# Patient Record
Sex: Female | Born: 1950 | Race: White | Hispanic: No | Marital: Married | Smoking: Former smoker
Health system: Southern US, Community
[De-identification: ages and names within clinical notes are randomized; demographics above are authoritative.]

## PROBLEM LIST (undated history)

## (undated) DIAGNOSIS — E079 Disorder of thyroid, unspecified: Secondary | ICD-10-CM

## (undated) DIAGNOSIS — C50919 Malignant neoplasm of unspecified site of unspecified female breast: Secondary | ICD-10-CM

## (undated) DIAGNOSIS — Z800 Family history of malignant neoplasm of digestive organs: Secondary | ICD-10-CM

## (undated) DIAGNOSIS — Z9221 Personal history of antineoplastic chemotherapy: Secondary | ICD-10-CM

## (undated) DIAGNOSIS — Z808 Family history of malignant neoplasm of other organs or systems: Secondary | ICD-10-CM

## (undated) DIAGNOSIS — Z803 Family history of malignant neoplasm of breast: Secondary | ICD-10-CM

## (undated) DIAGNOSIS — Z853 Personal history of malignant neoplasm of breast: Secondary | ICD-10-CM

## (undated) DIAGNOSIS — E785 Hyperlipidemia, unspecified: Secondary | ICD-10-CM

## (undated) DIAGNOSIS — Z923 Personal history of irradiation: Secondary | ICD-10-CM

## (undated) HISTORY — DX: Family history of malignant neoplasm of other organs or systems: Z80.8

## (undated) HISTORY — DX: Family history of malignant neoplasm of digestive organs: Z80.0

## (undated) HISTORY — PX: BREAST LUMPECTOMY WITH AXILLARY LYMPH NODE BIOPSY: SHX5593

## (undated) HISTORY — DX: Hyperlipidemia, unspecified: E78.5

## (undated) HISTORY — DX: Malignant neoplasm of unspecified site of unspecified female breast: C50.919

## (undated) HISTORY — DX: Family history of malignant neoplasm of breast: Z80.3

## (undated) HISTORY — PX: TONSILLECTOMY: SUR1361

## (undated) HISTORY — PX: WISDOM TOOTH EXTRACTION: SHX21

## (undated) HISTORY — DX: Disorder of thyroid, unspecified: E07.9

## (undated) HISTORY — PX: BREAST LUMPECTOMY: SHX2

## (undated) HISTORY — PX: COLONOSCOPY: SHX174

---
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
## 1999-05-12 ENCOUNTER — Other Ambulatory Visit: Admission: RE | Admit: 1999-05-12 | Discharge: 1999-05-12 | Payer: Self-pay | Admitting: Obstetrics and Gynecology

## 2001-05-23 ENCOUNTER — Encounter: Admission: RE | Admit: 2001-05-23 | Discharge: 2001-05-23 | Payer: Self-pay | Admitting: *Deleted

## 2001-05-23 ENCOUNTER — Other Ambulatory Visit: Admission: RE | Admit: 2001-05-23 | Discharge: 2001-05-23 | Payer: Self-pay | Admitting: Radiology

## 2001-05-23 ENCOUNTER — Encounter: Payer: Self-pay | Admitting: Obstetrics and Gynecology

## 2001-05-25 ENCOUNTER — Encounter: Admission: RE | Admit: 2001-05-25 | Discharge: 2001-05-25 | Payer: Self-pay | Admitting: General Surgery

## 2001-05-25 ENCOUNTER — Encounter: Payer: Self-pay | Admitting: General Surgery

## 2001-05-29 ENCOUNTER — Other Ambulatory Visit: Admission: RE | Admit: 2001-05-29 | Discharge: 2001-05-29 | Payer: Self-pay | Admitting: Obstetrics and Gynecology

## 2001-05-31 ENCOUNTER — Ambulatory Visit: Admission: RE | Admit: 2001-05-31 | Discharge: 2001-05-31 | Payer: Self-pay | Admitting: *Deleted

## 2001-05-31 ENCOUNTER — Encounter: Payer: Self-pay | Admitting: *Deleted

## 2001-06-01 ENCOUNTER — Encounter: Payer: Self-pay | Admitting: *Deleted

## 2001-06-01 ENCOUNTER — Encounter: Admission: RE | Admit: 2001-06-01 | Discharge: 2001-06-01 | Payer: Self-pay | Admitting: *Deleted

## 2001-06-04 ENCOUNTER — Encounter: Payer: Self-pay | Admitting: *Deleted

## 2001-06-04 ENCOUNTER — Ambulatory Visit (HOSPITAL_COMMUNITY): Admission: RE | Admit: 2001-06-04 | Discharge: 2001-06-04 | Payer: Self-pay | Admitting: *Deleted

## 2001-06-05 ENCOUNTER — Encounter: Payer: Self-pay | Admitting: General Surgery

## 2001-06-05 ENCOUNTER — Ambulatory Visit (HOSPITAL_COMMUNITY): Admission: RE | Admit: 2001-06-05 | Discharge: 2001-06-05 | Payer: Self-pay | Admitting: General Surgery

## 2001-06-22 ENCOUNTER — Other Ambulatory Visit: Admission: RE | Admit: 2001-06-22 | Discharge: 2001-06-22 | Payer: Self-pay | Admitting: Endocrinology

## 2001-07-26 ENCOUNTER — Encounter: Payer: Self-pay | Admitting: Hematology and Oncology

## 2001-07-26 ENCOUNTER — Ambulatory Visit (HOSPITAL_COMMUNITY): Admission: RE | Admit: 2001-07-26 | Discharge: 2001-07-26 | Payer: Self-pay | Admitting: Hematology and Oncology

## 2001-09-03 ENCOUNTER — Encounter: Admission: RE | Admit: 2001-09-03 | Discharge: 2001-09-03 | Payer: Self-pay | Admitting: General Surgery

## 2001-09-03 ENCOUNTER — Encounter: Payer: Self-pay | Admitting: General Surgery

## 2001-10-05 ENCOUNTER — Encounter: Admission: RE | Admit: 2001-10-05 | Discharge: 2001-10-05 | Payer: Self-pay | Admitting: General Surgery

## 2001-10-05 ENCOUNTER — Ambulatory Visit (HOSPITAL_COMMUNITY): Admission: RE | Admit: 2001-10-05 | Discharge: 2001-10-06 | Payer: Self-pay | Admitting: General Surgery

## 2001-10-05 ENCOUNTER — Encounter: Payer: Self-pay | Admitting: General Surgery

## 2001-10-05 ENCOUNTER — Encounter (INDEPENDENT_AMBULATORY_CARE_PROVIDER_SITE_OTHER): Payer: Self-pay | Admitting: *Deleted

## 2001-10-11 ENCOUNTER — Other Ambulatory Visit: Admission: RE | Admit: 2001-10-11 | Discharge: 2001-10-11 | Payer: Self-pay | Admitting: Radiology

## 2001-10-15 ENCOUNTER — Ambulatory Visit (HOSPITAL_COMMUNITY): Admission: RE | Admit: 2001-10-15 | Discharge: 2001-10-15 | Payer: Self-pay | Admitting: Endocrinology

## 2001-10-17 ENCOUNTER — Ambulatory Visit: Admission: RE | Admit: 2001-10-17 | Discharge: 2002-01-15 | Payer: Self-pay | Admitting: Radiation Oncology

## 2001-10-25 ENCOUNTER — Ambulatory Visit (HOSPITAL_COMMUNITY): Admission: RE | Admit: 2001-10-25 | Discharge: 2001-10-25 | Payer: Self-pay | Admitting: Endocrinology

## 2001-12-28 ENCOUNTER — Ambulatory Visit (HOSPITAL_COMMUNITY): Admission: RE | Admit: 2001-12-28 | Discharge: 2001-12-28 | Payer: Self-pay | Admitting: Endocrinology

## 2001-12-28 ENCOUNTER — Encounter: Payer: Self-pay | Admitting: Endocrinology

## 2002-01-30 ENCOUNTER — Encounter: Payer: Self-pay | Admitting: *Deleted

## 2002-01-30 ENCOUNTER — Ambulatory Visit (HOSPITAL_COMMUNITY): Admission: RE | Admit: 2002-01-30 | Discharge: 2002-01-30 | Payer: Self-pay | Admitting: *Deleted

## 2002-02-08 ENCOUNTER — Ambulatory Visit (HOSPITAL_COMMUNITY): Admission: RE | Admit: 2002-02-08 | Discharge: 2002-02-08 | Payer: Self-pay | Admitting: *Deleted

## 2002-02-08 ENCOUNTER — Encounter: Payer: Self-pay | Admitting: *Deleted

## 2002-03-15 ENCOUNTER — Encounter: Admission: RE | Admit: 2002-03-15 | Discharge: 2002-03-15 | Payer: Self-pay | Admitting: *Deleted

## 2002-03-15 ENCOUNTER — Encounter: Payer: Self-pay | Admitting: *Deleted

## 2002-06-03 ENCOUNTER — Other Ambulatory Visit: Admission: RE | Admit: 2002-06-03 | Discharge: 2002-06-03 | Payer: Self-pay | Admitting: Obstetrics and Gynecology

## 2002-07-01 ENCOUNTER — Encounter: Payer: Self-pay | Admitting: Endocrinology

## 2002-07-01 ENCOUNTER — Ambulatory Visit (HOSPITAL_COMMUNITY): Admission: RE | Admit: 2002-07-01 | Discharge: 2002-07-01 | Payer: Self-pay | Admitting: Endocrinology

## 2002-07-09 ENCOUNTER — Ambulatory Visit: Admission: RE | Admit: 2002-07-09 | Discharge: 2002-07-09 | Payer: Self-pay | Admitting: Radiation Oncology

## 2003-03-18 ENCOUNTER — Encounter: Admission: RE | Admit: 2003-03-18 | Discharge: 2003-03-18 | Payer: Self-pay | Admitting: Family Medicine

## 2003-03-18 ENCOUNTER — Encounter: Payer: Self-pay | Admitting: *Deleted

## 2003-07-17 ENCOUNTER — Other Ambulatory Visit: Admission: RE | Admit: 2003-07-17 | Discharge: 2003-07-17 | Payer: Self-pay | Admitting: Obstetrics and Gynecology

## 2003-09-01 ENCOUNTER — Ambulatory Visit (HOSPITAL_COMMUNITY): Admission: RE | Admit: 2003-09-01 | Discharge: 2003-09-01 | Payer: Self-pay | Admitting: Obstetrics and Gynecology

## 2003-09-01 ENCOUNTER — Encounter (INDEPENDENT_AMBULATORY_CARE_PROVIDER_SITE_OTHER): Payer: Self-pay | Admitting: *Deleted

## 2003-10-14 ENCOUNTER — Ambulatory Visit (HOSPITAL_COMMUNITY): Admission: RE | Admit: 2003-10-14 | Discharge: 2003-10-14 | Payer: Self-pay | Admitting: Endocrinology

## 2004-03-19 ENCOUNTER — Encounter: Admission: RE | Admit: 2004-03-19 | Discharge: 2004-03-19 | Payer: Self-pay | Admitting: General Surgery

## 2004-03-19 ENCOUNTER — Ambulatory Visit (HOSPITAL_COMMUNITY): Admission: RE | Admit: 2004-03-19 | Discharge: 2004-03-19 | Payer: Self-pay | Admitting: Hematology & Oncology

## 2004-08-06 ENCOUNTER — Ambulatory Visit (HOSPITAL_COMMUNITY): Admission: RE | Admit: 2004-08-06 | Discharge: 2004-08-06 | Payer: Self-pay | Admitting: Endocrinology

## 2004-09-10 ENCOUNTER — Ambulatory Visit: Payer: Self-pay | Admitting: Hematology & Oncology

## 2005-03-10 ENCOUNTER — Ambulatory Visit: Payer: Self-pay | Admitting: Hematology & Oncology

## 2005-03-28 ENCOUNTER — Encounter: Admission: RE | Admit: 2005-03-28 | Discharge: 2005-03-28 | Payer: Self-pay | Admitting: General Surgery

## 2005-05-04 ENCOUNTER — Encounter: Admission: RE | Admit: 2005-05-04 | Discharge: 2005-05-04 | Payer: Self-pay | Admitting: General Surgery

## 2005-06-02 ENCOUNTER — Ambulatory Visit (HOSPITAL_COMMUNITY): Admission: RE | Admit: 2005-06-02 | Discharge: 2005-06-02 | Payer: Self-pay | Admitting: Endocrinology

## 2005-06-14 ENCOUNTER — Encounter (INDEPENDENT_AMBULATORY_CARE_PROVIDER_SITE_OTHER): Payer: Self-pay | Admitting: *Deleted

## 2005-06-14 ENCOUNTER — Ambulatory Visit (HOSPITAL_COMMUNITY): Admission: RE | Admit: 2005-06-14 | Discharge: 2005-06-14 | Payer: Self-pay | Admitting: Endocrinology

## 2005-09-12 ENCOUNTER — Ambulatory Visit: Payer: Self-pay | Admitting: Hematology & Oncology

## 2006-01-23 ENCOUNTER — Ambulatory Visit (HOSPITAL_COMMUNITY): Admission: RE | Admit: 2006-01-23 | Discharge: 2006-01-23 | Payer: Self-pay | Admitting: Endocrinology

## 2006-02-09 ENCOUNTER — Ambulatory Visit: Payer: Self-pay | Admitting: Hematology & Oncology

## 2006-02-13 LAB — CBC WITH DIFFERENTIAL/PLATELET
Basophils Absolute: 0 10*3/uL (ref 0.0–0.1)
Eosinophils Absolute: 0.3 10*3/uL (ref 0.0–0.5)
HGB: 13.2 g/dL (ref 11.6–15.9)
MCV: 90.8 fL (ref 81.0–101.0)
MONO%: 7.9 % (ref 0.0–13.0)
NEUT#: 2.8 10*3/uL (ref 1.5–6.5)
RDW: 12.6 % (ref 11.3–14.5)

## 2006-02-13 LAB — COMPREHENSIVE METABOLIC PANEL
Albumin: 4.5 g/dL (ref 3.5–5.2)
Alkaline Phosphatase: 94 U/L (ref 39–117)
BUN: 27 mg/dL — ABNORMAL HIGH (ref 6–23)
Calcium: 9 mg/dL (ref 8.4–10.5)
Chloride: 107 mEq/L (ref 96–112)
Glucose, Bld: 92 mg/dL (ref 70–99)
Potassium: 4.2 mEq/L (ref 3.5–5.3)

## 2006-04-18 ENCOUNTER — Encounter: Admission: RE | Admit: 2006-04-18 | Discharge: 2006-04-18 | Payer: Self-pay | Admitting: General Surgery

## 2006-08-10 ENCOUNTER — Ambulatory Visit: Payer: Self-pay | Admitting: Hematology & Oncology

## 2006-08-14 LAB — CBC WITH DIFFERENTIAL/PLATELET
BASO%: 0.4 % (ref 0.0–2.0)
EOS%: 6.5 % (ref 0.0–7.0)
HCT: 38.2 % (ref 34.8–46.6)
MCH: 31.5 pg (ref 26.0–34.0)
MCHC: 34.5 g/dL (ref 32.0–36.0)
NEUT%: 57 % (ref 39.6–76.8)
RBC: 4.2 10*6/uL (ref 3.70–5.32)
RDW: 12.3 % (ref 11.3–14.5)
lymph#: 1.4 10*3/uL (ref 0.9–3.3)

## 2006-08-14 LAB — COMPREHENSIVE METABOLIC PANEL
ALT: 12 U/L (ref 0–35)
AST: 15 U/L (ref 0–37)
Calcium: 9.1 mg/dL (ref 8.4–10.5)
Chloride: 105 mEq/L (ref 96–112)
Creatinine, Ser: 0.73 mg/dL (ref 0.40–1.20)

## 2006-09-15 ENCOUNTER — Ambulatory Visit: Payer: Self-pay | Admitting: Internal Medicine

## 2007-02-08 ENCOUNTER — Ambulatory Visit: Payer: Self-pay | Admitting: Hematology & Oncology

## 2007-02-12 LAB — CBC WITH DIFFERENTIAL/PLATELET
Basophils Absolute: 0 10*3/uL (ref 0.0–0.1)
Eosinophils Absolute: 0.2 10*3/uL (ref 0.0–0.5)
HGB: 12.8 g/dL (ref 11.6–15.9)
MONO#: 0.4 10*3/uL (ref 0.1–0.9)
NEUT#: 3 10*3/uL (ref 1.5–6.5)
RDW: 12.5 % (ref 11.3–14.5)
lymph#: 1.4 10*3/uL (ref 0.9–3.3)

## 2007-02-12 LAB — COMPREHENSIVE METABOLIC PANEL
Albumin: 4.2 g/dL (ref 3.5–5.2)
BUN: 20 mg/dL (ref 6–23)
Calcium: 9 mg/dL (ref 8.4–10.5)
Chloride: 105 mEq/L (ref 96–112)
Glucose, Bld: 84 mg/dL (ref 70–99)
Potassium: 3.9 mEq/L (ref 3.5–5.3)

## 2007-04-10 ENCOUNTER — Ambulatory Visit (HOSPITAL_COMMUNITY): Admission: RE | Admit: 2007-04-10 | Discharge: 2007-04-10 | Payer: Self-pay | Admitting: Endocrinology

## 2007-04-30 ENCOUNTER — Encounter: Admission: RE | Admit: 2007-04-30 | Discharge: 2007-04-30 | Payer: Self-pay | Admitting: Obstetrics and Gynecology

## 2007-08-08 ENCOUNTER — Ambulatory Visit: Payer: Self-pay | Admitting: Hematology & Oncology

## 2007-08-13 LAB — COMPREHENSIVE METABOLIC PANEL
ALT: 10 U/L (ref 0–35)
AST: 15 U/L (ref 0–37)
Albumin: 4 g/dL (ref 3.5–5.2)
Alkaline Phosphatase: 71 U/L (ref 39–117)
BUN: 15 mg/dL (ref 6–23)
CO2: 28 mEq/L (ref 19–32)
Calcium: 8.3 mg/dL — ABNORMAL LOW (ref 8.4–10.5)
Chloride: 109 mEq/L (ref 96–112)
Creatinine, Ser: 0.69 mg/dL (ref 0.40–1.20)
Glucose, Bld: 84 mg/dL (ref 70–99)
Potassium: 3.8 mEq/L (ref 3.5–5.3)
Sodium: 146 mEq/L — ABNORMAL HIGH (ref 135–145)
Total Bilirubin: 0.3 mg/dL (ref 0.3–1.2)
Total Protein: 6.4 g/dL (ref 6.0–8.3)

## 2007-08-13 LAB — CBC WITH DIFFERENTIAL/PLATELET
Basophils Absolute: 0 10*3/uL (ref 0.0–0.1)
EOS%: 5.5 % (ref 0.0–7.0)
Eosinophils Absolute: 0.3 10*3/uL (ref 0.0–0.5)
HGB: 12.1 g/dL (ref 11.6–15.9)
MCH: 31.5 pg (ref 26.0–34.0)
NEUT#: 3.5 10*3/uL (ref 1.5–6.5)
RBC: 3.83 10*6/uL (ref 3.70–5.32)
RDW: 12.1 % (ref 11.3–14.5)
lymph#: 1.5 10*3/uL (ref 0.9–3.3)

## 2008-02-07 ENCOUNTER — Ambulatory Visit: Payer: Self-pay | Admitting: Hematology & Oncology

## 2008-03-18 ENCOUNTER — Encounter: Admission: RE | Admit: 2008-03-18 | Discharge: 2008-03-18 | Payer: Self-pay | Admitting: Hematology & Oncology

## 2008-05-30 ENCOUNTER — Encounter: Admission: RE | Admit: 2008-05-30 | Discharge: 2008-05-30 | Payer: Self-pay | Admitting: Hematology & Oncology

## 2008-06-10 ENCOUNTER — Encounter: Admission: RE | Admit: 2008-06-10 | Discharge: 2008-06-10 | Payer: Self-pay | Admitting: Hematology & Oncology

## 2008-09-22 ENCOUNTER — Ambulatory Visit (HOSPITAL_COMMUNITY): Admission: RE | Admit: 2008-09-22 | Discharge: 2008-09-22 | Payer: Self-pay | Admitting: Endocrinology

## 2008-10-28 ENCOUNTER — Encounter: Admission: RE | Admit: 2008-10-28 | Discharge: 2008-10-28 | Payer: Self-pay | Admitting: Endocrinology

## 2008-10-28 ENCOUNTER — Other Ambulatory Visit: Admission: RE | Admit: 2008-10-28 | Discharge: 2008-10-28 | Payer: Self-pay | Admitting: Interventional Radiology

## 2009-02-10 ENCOUNTER — Ambulatory Visit: Payer: Self-pay | Admitting: Hematology & Oncology

## 2009-02-12 ENCOUNTER — Ambulatory Visit: Payer: Self-pay | Admitting: Hematology & Oncology

## 2009-02-12 LAB — CBC WITH DIFFERENTIAL (CANCER CENTER ONLY)
BASO#: 0 10*3/uL (ref 0.0–0.2)
Eosinophils Absolute: 0.2 10*3/uL (ref 0.0–0.5)
HCT: 38.3 % (ref 34.8–46.6)
HGB: 13.1 g/dL (ref 11.6–15.9)
LYMPH#: 1.6 10*3/uL (ref 0.9–3.3)
NEUT#: 3.3 10*3/uL (ref 1.5–6.5)
NEUT%: 60.5 % (ref 39.6–80.0)
RBC: 4.32 10*6/uL (ref 3.70–5.32)

## 2009-02-12 LAB — COMPREHENSIVE METABOLIC PANEL
ALT: 13 U/L (ref 0–35)
AST: 15 U/L (ref 0–37)
Albumin: 4.2 g/dL (ref 3.5–5.2)
Calcium: 9.2 mg/dL (ref 8.4–10.5)
Chloride: 107 mEq/L (ref 96–112)
Potassium: 4.6 mEq/L (ref 3.5–5.3)
Total Protein: 7 g/dL (ref 6.0–8.3)

## 2009-04-29 ENCOUNTER — Encounter: Admission: RE | Admit: 2009-04-29 | Discharge: 2009-04-29 | Payer: Self-pay | Admitting: Endocrinology

## 2009-07-21 ENCOUNTER — Encounter: Admission: RE | Admit: 2009-07-21 | Discharge: 2009-07-21 | Payer: Self-pay | Admitting: Hematology & Oncology

## 2009-10-28 ENCOUNTER — Encounter: Admission: RE | Admit: 2009-10-28 | Discharge: 2009-10-28 | Payer: Self-pay | Admitting: Endocrinology

## 2010-01-24 ENCOUNTER — Ambulatory Visit: Payer: Self-pay | Admitting: Hematology & Oncology

## 2010-03-19 ENCOUNTER — Ambulatory Visit: Payer: Self-pay | Admitting: Hematology & Oncology

## 2010-03-22 LAB — COMPREHENSIVE METABOLIC PANEL
CO2: 27 mEq/L (ref 19–32)
Chloride: 108 mEq/L (ref 96–112)
Glucose, Bld: 126 mg/dL — ABNORMAL HIGH (ref 70–99)
Potassium: 3.7 mEq/L (ref 3.5–5.3)
Sodium: 143 mEq/L (ref 135–145)

## 2010-03-22 LAB — CBC WITH DIFFERENTIAL (CANCER CENTER ONLY)
BASO#: 0 10*3/uL (ref 0.0–0.2)
BASO%: 0.3 % (ref 0.0–2.0)
EOS%: 6.6 % (ref 0.0–7.0)
LYMPH%: 30.3 % (ref 14.0–48.0)
MCV: 90 fL (ref 81–101)
MONO%: 5.6 % (ref 0.0–13.0)
NEUT%: 57.2 % (ref 39.6–80.0)
RBC: 4.06 10*6/uL (ref 3.70–5.32)
WBC: 6.2 10*3/uL (ref 3.9–10.0)

## 2010-07-22 ENCOUNTER — Encounter: Admission: RE | Admit: 2010-07-22 | Discharge: 2010-07-22 | Payer: Self-pay | Admitting: Hematology & Oncology

## 2010-09-21 ENCOUNTER — Encounter
Admission: RE | Admit: 2010-09-21 | Discharge: 2010-09-21 | Payer: Self-pay | Source: Home / Self Care | Attending: Endocrinology | Admitting: Endocrinology

## 2010-10-17 ENCOUNTER — Encounter: Payer: Self-pay | Admitting: Endocrinology

## 2011-02-11 NOTE — Op Note (Signed)
[facility]  Patient:    Kristen Lynch, Kristen Lynch Visit Number: [id number] MRN: [id number]          Service Type: DSU Location: [id number] Attending Physician:  Carson Myrtle Dictated by:   Sheppard Plumber Earlene Plater, M.D. Proc. Date: 10/05/01 Admit Date:  10/05/2001 Discharge Date: 10/06/2001                             Operative Report  PREOPERATIVE DIAGNOSES: 1. Carcinoma, left breast. 2. Malfunctioning Port-A-Cath. 3. Thyroid nodule. 4. Skin lesions.  PROCEDURES: 1. Attempted revision of Port-A-Cath. 2. Reinstallation of Port-A-Cath. 3. Injection of sentinel dye. 4. Use of NeoProbe for mapping. 5. Sentinel lymph node biopsy x 2. 6. Partial mastectomy, left. 7. Aspiration of thyroid nodule. 8. Excision of skin lesion, left hand, right arm.  SURGEON:  Timothy E. Earlene Plater, M.D.  ASSISTANT:  Adolph Pollack, M.D.  ANESTHESIA:  C.R.N.A., supervised M.D.  CLINICAL NOTE:  Kristen Lynch is 39.  A breast cancer was discovered approximately three months ago.  Because of its size, she underwent neoadjuvant preoperative chemotherapy with satisfactory shrinkage of the tumor to allow for an attempted partial mastectomy instead of mastectomy.  A previously-placed Port-A-Cath is now malfunctioning.  She had a thyroid nodule that she and her endocrinologist wish to have aspirated.  She has severe sun exposure, and two lesions of the skin are requested to be biopsied.  She has been evaluated as an outpatient and by anesthesia today and is ready for surgery.  DESCRIPTION OF PROCEDURE:  The patient was taken to the operating room, placed supine, general endotracheal anesthesia administered.  Because of the late injection of the nuclear material, we approached the revision of the Port-A-Cath first. The patients entire upper chest, left breast, and axilla were prepped and draped in the usual fashion.  I cut down on the prior Port-A-Cath site in the right  upper chest.  It was dissected out of the tissue.  I disconnected the catheter from the port.  I tried to instill a wire into the catheter.  It would not proceed.  I then removed the port and catheter and attempted a percutaneous location of the right subclavian. Numerous attempts were made.  I isolated the vein easily, but the catheter would not thread.  There were no apparent complications.  I then isolated the left subclavian vein on the second pass.  The guidewire proceeded normally. We ascertained by real-time fluoroscopy that it was in good position.  The introducer was placed over the guidewire, and the catheter after irrigation was introduced through the introducer.  The introducer removed and the catheter positioned, carefully placed under fluoroscopy.  Because of the large opening in the right upper chest, I was able to thread the catheter across the chest from the insertion site in the left upper chest to the right upper chest location.  There was a nice, smooth curve, flow was good, aspiration was good, and I then connected the port to the Port-A-Cath, again tested it for flow and it was good.  I tested it with location by fluoroscopy, and that was good.  I sutured the Port-A-Cath to the pectoral fascia in the right upper quadrant and closed that wound in layers with 3-0 Monocryl.  I flushed the catheter with concentrated heparin solution.  I also evaluated both lung fields with fluoroscopy, and there was no evidence of pneumothorax.  Then I injected the  right tumor site and right subcu skin of the nipple with dye and massaged that for five minutes.  We then approached the left axilla with the NeoProbe, isolated one hot spot.  I cut down on this.  It was in the midaxilla under the pectoralis, an incision of approximately 2.5 cm, and with proper retraction we were able to isolate one hot node that was blue and one node that was moderately hot that was not stained.  We sent them  as specimens #1 and #2.  Bleeding was controlled with clips or cautery, and that wound was closed with a Monocryl as well.  I then approached the left breast mass.  A needle localization had been accomplished prior to surgery and using that as a guide, a curvilinear incision was made over the palpable mass in the left breast in the upper outer quadrant, incision of approximately 4 cm.  By blunt and sharp dissection, I removed a large mass or tissue encompassing essentially the entire left upper quadrant.  The wire was just below the level of dissection.  This encompassed the tissue from the edge of the areola to the upper outer quadrant and down to the pectoralis fascia.  I tagged the tissue for orientation for the pathologist and sent it down for their evaluation for margins.  Bleeding was controlled with the cautery, and I closed this wound with 3-0 Monocryl.  We then had heard from the pathologist that the two lymph nodes by touch prep were negative.  While we were waiting on the final frozen sections, I was able to isolate the thyroid nodule easily with the left hand, of course with new gloves and instruments, and I attempted aspiration of the thyroid nodule but there was no aspiratable liquid material, and there was no apparent complication.  Likewise, using fresh gloves and instruments, a lesion on the right upper arm posterior aspect and left hand posterior aspect were biopsied by simply slicing the small lesions off and cauterizing the base.  By this time we had heard from the pathologist that the margins were apparently negative and with this, the entire operation had come to a conclusion.  All counts were correct. All instruments were correct.  Steri-Strips had been applied to all wounds, and dry sterile dressings were applied.  She tolerated well the entire operation with just less than two hours operating time, and she was removed from the recovery room in good  condition. Dictated by:   Sheppard Plumber Earlene Plater, M.D. Attending Physician:  Carson Myrtle DD:  10/07/01 TD:  10/08/01 Job: [id number]

## 2011-02-11 NOTE — Op Note (Signed)
NAME:  Kristen Lynch, Kristen Lynch                          ACCOUNT NO.:  [id number]   MEDICAL RECORD NO.:  [id number]                   PATIENT TYPE:  AMB   LOCATION:  SDC                                  FACILITY:  [facility]   PHYSICIAN:  Lenoard Aden, M.D.             DATE OF BIRTH:  Jan 13, 1951   DATE OF PROCEDURE:  09/01/2003  DATE OF DISCHARGE:                                 OPERATIVE REPORT   PREOPERATIVE DIAGNOSES:  1. Postmenopausal bleeding on tamoxifen.  2. History of breast cancer.  3. Postoperative multiple endometrial polyps.   PROCEDURE:  Diagnostic hysteroscopy, resectoscopic polypectomy, D&C.   SURGEON:  Lenoard Aden, M.D.   ANESTHESIA:  General anesthesia.   ESTIMATED BLOOD LOSS:  750 mL.   COMPLICATIONS:  None.   FLUID DEFICIT:  100 mL.   SPECIMENS:  Multiple uterine polyps and endometrial curettings to pathology.   DESCRIPTION OF PROCEDURE:  After being apprised of risks of anesthesia,  infection, bleeding, uterine perforation and possible need for repair, the  patient is brought to the operating room where she is administered general  anesthetic, prepped and draped in usual fashion, was catheterized until the  bladder was empty.  After achieving adequate anesthesia, dilute Nesacaine  solution placed in the __________ paracervical block at 4 and 8 o'clock.  The standard intracervical anesthesia using a dilute Pitressin solution is  placed at 3 and 9 o'clock at the cervicovaginal junction using 16 mL of  dilute Pitressin solution.  At this time, cervix easily dilated up to #31  [facility] dilator and hysteroscope reveals a large right lower uterine segment  polyp and a fundal anterior wall polyp as previously noted on sonohystogram,  the double angle loop is used to resect the lower polyp using two sweeps  with complete removal.  The upper polyp is adherent to the back wall.  Thereby, this is initially shaved off in half and bivalve using the double  angle loop and  then removed in its entirety using multiple passes.  Bilateral tuberosity is noted.  Uterine endometrial cavity integrity is  intact.  No evidence of perforation.  Good hemostasis is noted.  Fluid  deficit of 100 mL is noted.  Sharp curetting of the endometrial cavity is  performed using a serrated endometrial curette with minimal curettings  obtained.  Hysteroscope is removed.  Minimal bleeding noted.  Tenaculum and  speculum is removed.  The patient is awakened and transferred to recovery in  good condition.                                               Lenoard Aden, M.D.    RJT/MEDQ  D:  09/01/2003  T:  09/02/2003  Job:  [id number]

## 2011-02-11 NOTE — Procedures (Signed)
[facility]  Patient:    Kristen Lynch, Kristen Lynch Visit Number: [id number] MRN: [id number]          Service Type: DSU Location: DAY Attending Physician:  Carson Myrtle Dictated by:   Sheppard Plumber Earlene Plater, M.D. Proc. Date: 06/05/01 Admit Date:  06/05/2001                             Procedure Report  PREOPERATIVE DIAGNOSIS:  Carcinoma left breast.  POSTOPERATIVE DIAGNOSIS:  Carcinoma left breast.  OPERATION/PROCEDURE:  Insertion of Port-A-Cath.  SURGEON: Timothy E. Earlene Plater, M.D.  ANESTHESIA:  Local standby.  DESCRIPTION OF PROCEDURE:  This patient is 6 and recently diagnosed with a moderately large tumor, and a moderately small left breast.  She has elected to proceed with preoperative chemoradiation and a Port-A-Cath is being inserted today for triple chemotherapy.  She agrees and understands the procedure.  The patient was brought to the room, placed supine, carefully padded and cushioned, and positioned.  She was then placed in the head-down position, and the upper chest and neck were prepped and draped in the usual fashion.  The right side was approached using 1/4% Marcaine with epinephrine throughout for local anesthesia.  The right subclavian vein was isolated on the first pass, guide wire inserted, and ascertained to be correct by fluoroscopy.  The introducer placed over the wire, ascertained to be in the correct position, guidewire removed, and we introduced a heparinized 8.4 French catheter through the introducer sheath.  As we were leaving the introducer sheath, the catheter would not progress forward.  So, I manipulated that with three different guidewires and was unable to advance the Port-A-Cath catheter into the appropriate position. So, I removed the entire apparatus and again we approached the right subclavian vein which was isolated on the first pass.  I introduced a new guidewire, passed it well down into the inferior vena cava and  then passed the introducer sheath over the guidewire.  It was in good position.  The guidewire was removed, and now the Port-A-Cath catheter went easily into the right position and the introducer sheath removed without any trouble.  This was positioned correctly under real-time fluoroscopy.  The catheter had been ______.  It was now clamped and then over the right anterior chest a pocket was made for the port site and the catheter was tunneled from the introduction site to the port site, the appropriate length cut, and the locking device applied to the catheter.  The catheter attached to the port and the locking mechanism closed.  All seemed to be in good shape. Fluoroscopy was used again and there were no kinks and no complications.  A photograph was made of this fluoroscopy picture showing a good smooth alignment of the catheter and port.  The port was then sewn to the prepectoral fascia with 2-0 Prolene and the wound was closed in layers with 3-0 Monocryl. Steri-Strips applied.  OpSite applied over the port site and the full strength Heparin then was used to irrigate and lock the port.  This was left intact for chemotherapy tomorrow.  Counts were correct.  Dry sterile bandage applied.  She tolerated well and was removed to recovery room in good condition.  A follow up chest x-ray will be made.  She tolerated well.  Instructions given to her and her family and she will be followed as an outpatient. Dictated by:   Sheppard Plumber Earlene Plater, M.D. Attending Physician:  Carson Myrtle DD:  06/05/01 TD:  06/05/01 Job: [id number]

## 2011-03-22 ENCOUNTER — Other Ambulatory Visit: Payer: Self-pay | Admitting: Hematology & Oncology

## 2011-03-22 ENCOUNTER — Encounter (HOSPITAL_BASED_OUTPATIENT_CLINIC_OR_DEPARTMENT_OTHER): Payer: Managed Care, Other (non HMO) | Admitting: Hematology & Oncology

## 2011-03-22 DIAGNOSIS — Z170 Estrogen receptor positive status [ER+]: Secondary | ICD-10-CM

## 2011-03-22 DIAGNOSIS — C50419 Malignant neoplasm of upper-outer quadrant of unspecified female breast: Secondary | ICD-10-CM

## 2011-03-22 LAB — CBC WITH DIFFERENTIAL (CANCER CENTER ONLY)
BASO%: 0.4 % (ref 0.0–2.0)
EOS%: 8 % — ABNORMAL HIGH (ref 0.0–7.0)
HGB: 12.5 g/dL (ref 11.6–15.9)
LYMPH#: 1.8 10*3/uL (ref 0.9–3.3)
MCHC: 34.1 g/dL (ref 32.0–36.0)
MONO#: 0.5 10*3/uL (ref 0.1–0.9)
NEUT#: 5 10*3/uL (ref 1.5–6.5)
RDW: 11.9 % (ref 11.1–15.7)
WBC: 8 10*3/uL (ref 3.9–10.0)

## 2011-03-22 LAB — COMPREHENSIVE METABOLIC PANEL
ALT: 13 U/L (ref 0–35)
CO2: 27 mEq/L (ref 19–32)
Calcium: 8.9 mg/dL (ref 8.4–10.5)
Chloride: 106 mEq/L (ref 96–112)
Creatinine, Ser: 0.7 mg/dL (ref 0.50–1.10)
Glucose, Bld: 112 mg/dL — ABNORMAL HIGH (ref 70–99)
Total Protein: 6.3 g/dL (ref 6.0–8.3)

## 2011-03-22 LAB — VITAMIN D 25 HYDROXY (VIT D DEFICIENCY, FRACTURES): Vit D, 25-Hydroxy: 53 ng/mL (ref 30–89)

## 2011-05-24 ENCOUNTER — Other Ambulatory Visit: Payer: Self-pay | Admitting: Endocrinology

## 2011-05-24 DIAGNOSIS — E049 Nontoxic goiter, unspecified: Secondary | ICD-10-CM

## 2011-06-15 ENCOUNTER — Other Ambulatory Visit: Payer: Self-pay | Admitting: Hematology & Oncology

## 2011-06-15 DIAGNOSIS — Z1231 Encounter for screening mammogram for malignant neoplasm of breast: Secondary | ICD-10-CM

## 2011-07-25 ENCOUNTER — Ambulatory Visit
Admission: RE | Admit: 2011-07-25 | Discharge: 2011-07-25 | Disposition: A | Discharge status: Home / Self Care | Payer: Managed Care, Other (non HMO) | Source: Ambulatory Visit | Attending: Hematology & Oncology | Admitting: Hematology & Oncology

## 2011-07-25 DIAGNOSIS — Z1231 Encounter for screening mammogram for malignant neoplasm of breast: Secondary | ICD-10-CM

## 2011-08-29 ENCOUNTER — Ambulatory Visit
Admission: RE | Admit: 2011-08-29 | Discharge: 2011-08-29 | Disposition: A | Discharge status: Home / Self Care | Payer: Managed Care, Other (non HMO) | Source: Ambulatory Visit | Attending: Endocrinology | Admitting: Endocrinology

## 2011-08-29 DIAGNOSIS — E049 Nontoxic goiter, unspecified: Secondary | ICD-10-CM

## 2012-03-16 ENCOUNTER — Other Ambulatory Visit: Payer: Self-pay | Admitting: Endocrinology

## 2012-03-16 DIAGNOSIS — E049 Nontoxic goiter, unspecified: Secondary | ICD-10-CM

## 2012-03-21 ENCOUNTER — Other Ambulatory Visit: Payer: Managed Care, Other (non HMO) | Admitting: Lab

## 2012-03-21 ENCOUNTER — Ambulatory Visit: Payer: Managed Care, Other (non HMO) | Admitting: Hematology & Oncology

## 2012-03-21 ENCOUNTER — Ambulatory Visit (HOSPITAL_BASED_OUTPATIENT_CLINIC_OR_DEPARTMENT_OTHER): Payer: Managed Care, Other (non HMO) | Admitting: Hematology & Oncology

## 2012-03-21 ENCOUNTER — Other Ambulatory Visit (HOSPITAL_BASED_OUTPATIENT_CLINIC_OR_DEPARTMENT_OTHER): Payer: Managed Care, Other (non HMO) | Admitting: Lab

## 2012-03-21 VITALS — BP 124/65 | HR 74 | Temp 97.8°F | Ht 64.0 in | Wt 131.0 lb

## 2012-03-21 DIAGNOSIS — M810 Age-related osteoporosis without current pathological fracture: Secondary | ICD-10-CM

## 2012-03-21 DIAGNOSIS — C50919 Malignant neoplasm of unspecified site of unspecified female breast: Secondary | ICD-10-CM

## 2012-03-21 DIAGNOSIS — Z853 Personal history of malignant neoplasm of breast: Secondary | ICD-10-CM

## 2012-03-21 LAB — CBC WITH DIFFERENTIAL (CANCER CENTER ONLY)
BASO#: 0 10*3/uL (ref 0.0–0.2)
BASO%: 0.3 % (ref 0.0–2.0)
EOS%: 4.5 % (ref 0.0–7.0)
HCT: 36.5 % (ref 34.8–46.6)
HGB: 12.1 g/dL (ref 11.6–15.9)
LYMPH#: 1.3 10*3/uL (ref 0.9–3.3)
MCH: 30.7 pg (ref 26.0–34.0)
MCHC: 33.2 g/dL (ref 32.0–36.0)
MONO%: 8.1 % (ref 0.0–13.0)
NEUT%: 70 % (ref 39.6–80.0)
RDW: 12 % (ref 11.1–15.7)

## 2012-03-21 NOTE — Progress Notes (Signed)
CC:   Kristen Lynch, M.D.  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  CURRENT THERAPY:  Observation.  INTERIM HISTORY:  Kristen Lynch comes in for followup.  We see her every year.  Since we last saw her, she has really done very well.  She is having some thyroid issues.  She has ultrasound done a couple of times a year.  She otherwise has really had no other problems.  She had a mammogram done, I think, every fall.  She had a mammogram done July 26, 2011.  Everything looked fine with the mammogram.  She went to the beach already.  She had a good time.  She was not as liberal with her sunscreen as I would like.  However, she says that she will be more conscientious.  She has spent a lot of a time with her mom.  She is still looking for work.  However, she feels that being with the mother is just as important if not more important.  She has had no problems with bowels or bladder.  She has had no headache.  There has been no double vision or blurred vision.  PHYSICAL EXAMINATION:  General:  This is a well-developed, well- nourished, white female in no obvious distress.  Vital Signs: Temperature of 97.8, pulse 74, respiratory rate 18, blood pressure 124/65.  Weight is 131.  Head and Neck:  Normocephalic, atraumatic skull.  There are no ocular or oral lesions.  There are no palpable cervical or supraclavicular lymph nodes.  Lungs:  Clear bilaterally. Cardiac:  Regular rate and rhythm with a normal S1 and S2.  There are no murmurs, rubs, or bruits.  Breasts:  Right breast with no masses, edema, or erythema.  There is no right axillary adenopathy.  Left breast shows some slight contraction from past surgery and radiation.  She has a well- healed lumpectomy at the 2 o'clock position.  There is some slight firmness at the lumpectomy site.  No distinct masses are noted in the left breast.  There is no left axillary adenopathy.  Abdomen:  Soft  with good bowel sounds.  There is no palpable abdominal mass.  There is no fluid wave.  There is no palpable hepatosplenomegaly.  Back:  No kyphosis or osteoporotic changes.  No tenderness is noted over the spine, ribs, or hips.  Extremities:  No clubbing, cyanosis, or edema. She has good range of motion of her joints.  Skin:  Very fair skin.  I do not see any suspicious hyperpigmented lesions.  LABORATORY STUDIES:  White cell count 7.4, hemoglobin 12.1, hematocrit 36.5, platelet count 192.  IMPRESSION:  Kristen Lynch is a 61 year old white female with stage IIA ductal carcinoma of the left breast.  She underwent lumpectomy back in 2003.  She did receive neoadjuvant chemotherapy with Taxotere/Adriamycin/Cytoxan.  She presented back in August of 2002.  She had 4 cycles of chemotherapy.  She is doing very well right now.  She was on adjuvant Arimidex following her surgery and radiation.  I do not see any evidence of recurrent disease.  I think her risk of recurrence is 5% or less at this point in time.  We will go ahead and plan to get her back in 1 more year for followup.    ______________________________ Josph Macho, M.D. PRE/MEDQ  D:  03/21/2012  T:  03/21/2012  Job:  [id number]

## 2012-03-21 NOTE — Progress Notes (Signed)
This office note has been dictated.

## 2012-03-22 LAB — COMPREHENSIVE METABOLIC PANEL
ALT: 13 U/L (ref 0–35)
AST: 18 U/L (ref 0–37)
Alkaline Phosphatase: 63 U/L (ref 39–117)
BUN: 19 mg/dL (ref 6–23)
Chloride: 108 mEq/L (ref 96–112)
Creatinine, Ser: 0.64 mg/dL (ref 0.50–1.10)
Total Bilirubin: 0.5 mg/dL (ref 0.3–1.2)

## 2012-03-26 ENCOUNTER — Telehealth: Payer: Self-pay | Admitting: *Deleted

## 2012-03-26 NOTE — Telephone Encounter (Signed)
Called patient to let her know that her labs and vitamin d were good per dr. Myna Hidalgo

## 2012-03-26 NOTE — Telephone Encounter (Signed)
Message copied by Anselm Jungling on Mon Mar 26, 2012 10:31 AM ------      Message from: Josph Macho      Created: Sun Mar 25, 2012  8:10 PM       Labs and vit D look good!!! Cindee Lame

## 2012-07-30 ENCOUNTER — Other Ambulatory Visit: Payer: Self-pay | Admitting: Hematology & Oncology

## 2012-07-30 DIAGNOSIS — Z1231 Encounter for screening mammogram for malignant neoplasm of breast: Secondary | ICD-10-CM

## 2012-07-30 DIAGNOSIS — Z853 Personal history of malignant neoplasm of breast: Secondary | ICD-10-CM

## 2012-07-30 DIAGNOSIS — Z9889 Other specified postprocedural states: Secondary | ICD-10-CM

## 2012-08-27 ENCOUNTER — Ambulatory Visit
Admission: RE | Admit: 2012-08-27 | Discharge: 2012-08-27 | Disposition: A | Discharge status: Home / Self Care | Payer: BC Managed Care – PPO | Source: Ambulatory Visit | Attending: Hematology & Oncology | Admitting: Hematology & Oncology

## 2012-08-27 DIAGNOSIS — Z853 Personal history of malignant neoplasm of breast: Secondary | ICD-10-CM

## 2012-08-27 DIAGNOSIS — Z1231 Encounter for screening mammogram for malignant neoplasm of breast: Secondary | ICD-10-CM

## 2012-08-27 DIAGNOSIS — Z9889 Other specified postprocedural states: Secondary | ICD-10-CM

## 2012-09-03 ENCOUNTER — Ambulatory Visit
Admission: RE | Admit: 2012-09-03 | Discharge: 2012-09-03 | Disposition: A | Discharge status: Home / Self Care | Payer: BC Managed Care – PPO | Source: Ambulatory Visit | Attending: Endocrinology | Admitting: Endocrinology

## 2012-09-03 DIAGNOSIS — E049 Nontoxic goiter, unspecified: Secondary | ICD-10-CM

## 2013-03-14 ENCOUNTER — Other Ambulatory Visit: Payer: Self-pay | Admitting: Endocrinology

## 2013-03-14 DIAGNOSIS — E041 Nontoxic single thyroid nodule: Secondary | ICD-10-CM

## 2013-03-21 ENCOUNTER — Ambulatory Visit (HOSPITAL_BASED_OUTPATIENT_CLINIC_OR_DEPARTMENT_OTHER): Payer: BC Managed Care – PPO | Admitting: Hematology & Oncology

## 2013-03-21 ENCOUNTER — Other Ambulatory Visit: Payer: Self-pay | Admitting: *Deleted

## 2013-03-21 ENCOUNTER — Other Ambulatory Visit (HOSPITAL_BASED_OUTPATIENT_CLINIC_OR_DEPARTMENT_OTHER): Payer: BC Managed Care – PPO | Admitting: Lab

## 2013-03-21 ENCOUNTER — Other Ambulatory Visit (HOSPITAL_BASED_OUTPATIENT_CLINIC_OR_DEPARTMENT_OTHER): Payer: BC Managed Care – PPO

## 2013-03-21 VITALS — BP 128/63 | HR 74 | Temp 98.1°F | Resp 16 | Ht 64.0 in | Wt 123.0 lb

## 2013-03-21 DIAGNOSIS — E559 Vitamin D deficiency, unspecified: Secondary | ICD-10-CM

## 2013-03-21 DIAGNOSIS — C50912 Malignant neoplasm of unspecified site of left female breast: Secondary | ICD-10-CM

## 2013-03-21 DIAGNOSIS — C50919 Malignant neoplasm of unspecified site of unspecified female breast: Secondary | ICD-10-CM

## 2013-03-21 DIAGNOSIS — R921 Mammographic calcification found on diagnostic imaging of breast: Secondary | ICD-10-CM

## 2013-03-21 DIAGNOSIS — R928 Other abnormal and inconclusive findings on diagnostic imaging of breast: Secondary | ICD-10-CM

## 2013-03-21 LAB — CBC WITH DIFFERENTIAL (CANCER CENTER ONLY)
BASO%: 0.5 % (ref 0.0–2.0)
Eosinophils Absolute: 0.2 10*3/uL (ref 0.0–0.5)
HCT: 39.6 % (ref 34.8–46.6)
HGB: 12.9 g/dL (ref 11.6–15.9)
LYMPH#: 1.3 10*3/uL (ref 0.9–3.3)
LYMPH%: 23.5 % (ref 14.0–48.0)
MCV: 95 fL (ref 81–101)
MONO#: 0.6 10*3/uL (ref 0.1–0.9)
NEUT%: 62.1 % (ref 39.6–80.0)
RBC: 4.18 10*6/uL (ref 3.70–5.32)
RDW: 12.1 % (ref 11.1–15.7)
WBC: 5.7 10*3/uL (ref 3.9–10.0)

## 2013-03-21 NOTE — Progress Notes (Signed)
This office note has been dictated.

## 2013-03-22 ENCOUNTER — Telehealth: Payer: Self-pay | Admitting: Oncology

## 2013-03-22 LAB — COMPREHENSIVE METABOLIC PANEL
ALT: 13 U/L (ref 0–35)
CO2: 30 mEq/L (ref 19–32)
Calcium: 9.1 mg/dL (ref 8.4–10.5)
Chloride: 108 mEq/L (ref 96–112)
Creatinine, Ser: 0.63 mg/dL (ref 0.50–1.10)
Glucose, Bld: 60 mg/dL — ABNORMAL LOW (ref 70–99)
Total Bilirubin: 0.5 mg/dL (ref 0.3–1.2)
Total Protein: 6.4 g/dL (ref 6.0–8.3)

## 2013-03-22 LAB — LACTATE DEHYDROGENASE: LDH: 158 U/L (ref 94–250)

## 2013-03-22 LAB — VITAMIN D 25 HYDROXY (VIT D DEFICIENCY, FRACTURES): Vit D, 25-Hydroxy: 44 ng/mL (ref 30–89)

## 2013-03-22 NOTE — Telephone Encounter (Addendum)
Message copied by Lacie Draft on Fri Mar 22, 2013 11:35 AM ------      Message from: Josph Macho      Created: Fri Mar 22, 2013  7:02 AM       Please call and let her know that her labs look good. Pete ------Spoke with pt regarding labs.

## 2013-03-22 NOTE — Progress Notes (Signed)
CC:   Kristen Lynch, M.D.  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  CURRENT THERAPY:  Observation.  INTERIM HISTORY:  [name] comes in for followup.  We see her yearly. She is doing quite well.  I got on her a little bit today because of a sunburn that she got done at the beach.  She has very fair skin. Otherwise, she seems to be doing fairly well.  She has had no problems with bowels or bladder.  There has been no cough or shortness breath. She I think is on thyroid medicine.  This is well regulated.  Her last mammogram was back in December.  Everything looked okay on the mammogram with no suspicious calcifications.  PHYSICAL EXAMINATION:  General:  This is a well-developed, well- nourished white female in no obvious distress.  Vital signs: Temperature of 98.1, pulse 74, respiratory rate 16, blood pressure 138/62.  Weight is 123.  Head and neck:  Normocephalic, atraumatic skull.  There are no ocular or oral lesions.  There are no palpable cervical or supraclavicular lymph nodes.  Lungs:  Clear bilaterally. Cardiac:  Regular rate and rhythm with a normal S1, S2.  There are no murmurs, rubs, or bruits.  Abdomen:  Soft with good bowel sounds.  There is no palpable abdominal mass.  There is no fluid wave.  There is no palpable hepatosplenomegaly.  Breasts:  Shows right breast no masses, edema or erythema.  There is no right axillary adenopathy.  Left breast shows well-healed lumpectomy at the 2 o'clock position.  There is some slight contraction of the left breast.  There is no distinct mass in the left breast.  There is no left axillary adenopathy.  Abdomen:  Soft with good bowel sounds.  There is no fluid wave.  There is no palpable hepatosplenomegaly.  Back:  Shows no kyphosis or osteoporotic changes. There is no tenderness over the spine, ribs, or hips.  Extremities: Show no clubbing, cyanosis, or edema.  Neurological:  Shows no  focal neurological deficits.  Skin:  Shows no suspicious hyperpigmented lesions.  LABORATORY STUDIES:  White cell count is 5.7, hemoglobin 13, hematocrit 39.6, platelet count 191.  IMPRESSION:  Kristen Lynch is a very charming 62 year old white female with history of stage IIA (T2 N0 M0)ductal carcinoma of the left breast.  She did receive adjuvant chemotherapy with TAC. She completed 4 cycles back in August of 2002.  She then received adjuvant radiation therapy.  She then had Arimidex.  We will get her back in 1 more year.  Again, I do not see that her risk of recurrence should be more than 5% to 10%.   ______________________________ Josph Macho, M.D. PRE/MEDQ  D:  03/21/2013  T:  03/22/2013  Job:  [id number]

## 2013-08-05 ENCOUNTER — Other Ambulatory Visit: Payer: Self-pay

## 2013-08-05 DIAGNOSIS — Z1231 Encounter for screening mammogram for malignant neoplasm of breast: Secondary | ICD-10-CM

## 2013-09-03 ENCOUNTER — Ambulatory Visit
Admission: RE | Admit: 2013-09-03 | Discharge: 2013-09-03 | Disposition: A | Discharge status: Home / Self Care | Payer: BC Managed Care – PPO | Source: Ambulatory Visit

## 2013-09-03 DIAGNOSIS — Z1231 Encounter for screening mammogram for malignant neoplasm of breast: Secondary | ICD-10-CM

## 2013-09-09 ENCOUNTER — Ambulatory Visit
Admission: RE | Admit: 2013-09-09 | Discharge: 2013-09-09 | Disposition: A | Discharge status: Home / Self Care | Payer: BC Managed Care – PPO | Source: Ambulatory Visit | Attending: Endocrinology | Admitting: Endocrinology

## 2013-09-09 DIAGNOSIS — E041 Nontoxic single thyroid nodule: Secondary | ICD-10-CM

## 2013-10-09 ENCOUNTER — Ambulatory Visit (INDEPENDENT_AMBULATORY_CARE_PROVIDER_SITE_OTHER): Payer: Self-pay | Admitting: General Surgery

## 2013-10-11 ENCOUNTER — Ambulatory Visit (INDEPENDENT_AMBULATORY_CARE_PROVIDER_SITE_OTHER): Payer: BC Managed Care – PPO | Admitting: Surgery

## 2013-10-11 ENCOUNTER — Encounter (INDEPENDENT_AMBULATORY_CARE_PROVIDER_SITE_OTHER): Payer: Self-pay

## 2013-10-11 ENCOUNTER — Encounter (INDEPENDENT_AMBULATORY_CARE_PROVIDER_SITE_OTHER): Payer: Self-pay | Admitting: Surgery

## 2013-10-11 VITALS — BP 122/70 | HR 72 | Temp 98.8°F | Resp 14 | Ht 64.0 in | Wt 125.8 lb

## 2013-10-11 DIAGNOSIS — D449 Neoplasm of uncertain behavior of unspecified endocrine gland: Secondary | ICD-10-CM

## 2013-10-11 DIAGNOSIS — D440 Neoplasm of uncertain behavior of thyroid gland: Secondary | ICD-10-CM

## 2013-10-11 DIAGNOSIS — E042 Nontoxic multinodular goiter: Secondary | ICD-10-CM

## 2013-10-11 NOTE — Patient Instructions (Signed)

## 2013-10-11 NOTE — Progress Notes (Signed)
General Surgery [facility]  Chief Complaint  Patient presents with  . New Evaluation    enlarging thyroid nodule, left - referral from Dr. Gareth Eagle    HISTORY: The patient is a 63 year old female previously evaluated our practice for thyroid nodules. She is followed by her endocrinologist, Dr. Gareth Eagle. Patient has had previous ultrasound examinations over the years. Her most recent study in December 2014 shows a dominant nodule in the left thyroid lobe with a maximum dimension of 4.9 cm. This is an interval increase in size compared to her prior ultrasound in December 2013. Right thyroid lobe contained multiple subcentimeter nodules. Patient has had prior fine-needle aspiration biopsies in 2000 03/05/2009. Each of these were benign.  Patient has had no prior head or neck surgery. She is currently taking Synthroid 100 mcg daily and has been on that medication for a number of years. There is a family history of goiter and a paternal grandmother and a paternal aunt. There is no family history of thyroid cancer. There is no family history of other endocrine neoplasm.  Recent laboratory studies show a suppressed TSH level of 0.04 and a slightly elevated total T4 level of 14.1  Past Medical History  Diagnosis Date  . Cancer     breast    Current Outpatient Prescriptions  Medication Sig Dispense Refill  . levothyroxine (SYNTHROID) 100 MCG tablet Take 100 mcg by mouth daily.      . rosuvastatin (CRESTOR) 10 MG tablet Take 10 mg by mouth daily.       No current facility-administered medications for this visit.    No Known Allergies  Family History  Problem Relation Age of Onset  . Cancer Mother     cervical  . Stroke Mother   . Heart disease Father   . Cancer Maternal Aunt     colon    History   Social History  . Marital Status: Married    Spouse Name: N/A    Number of Children: N/A  . Years of Education: N/A   Social History Main Topics  .  Smoking status: Former Smoker    Quit date: 09/26/1977  . Smokeless tobacco: Never Used  . Alcohol Use: No  . Drug Use: None  . Sexual Activity: None   Other Topics Concern  . None   Social History Narrative  . None    REVIEW OF SYSTEMS - PERTINENT POSITIVES ONLY: Patient does note occasional palpitations. She denies tremor. She denies all compressive symptoms.  EXAM: Filed Vitals:   10/11/13 0853  BP: 122/70  Pulse: 72  Temp: 98.8 F (37.1 C)  Resp: 14    GENERAL: well-developed, well-nourished, no acute distress HEENT: normocephalic; pupils equal and reactive; sclerae clear; dentition good; mucous membranes moist NECK:  Dominant smooth firm mass occupying the entire left thyroid lobe measuring approximately 4-5 cm in greatest dimension; isthmus and right thyroid lobe without palpable mass; asymmetric on extension; no palpable anterior or posterior cervical lymphadenopathy; no supraclavicular masses; no tenderness CHEST: clear to auscultation bilaterally without rales, rhonchi, or wheezes CARDIAC: regular rate and rhythm without significant murmur; peripheral pulses are full EXT:  non-tender without edema; no deformity NEURO: no gross focal deficits; no sign of tremor   LABORATORY RESULTS: See [facility] (CHL-Epic) for most recent results  RADIOLOGY RESULTS: See [facility] (CHL-Epic) for most recent results  IMPRESSION: #1 multiple thyroid nodules, dominant nodule left thyroid lobe measuring 4.9 cm of uncertain significance, interval enlargement #2  iatrogenic hyperthyroidism  PLAN: The patient and I reviewed all the above studies at length. I provided her with copies of her ultrasound results and her laboratory studies.  The patient and I discussed options for management. This includes continue close observation versus proceeding with thyroid surgery. We discussed the type of surgery to be employed and I have recommended total thyroidectomy if she elects to  undergo surgery. At this point there is no absolute indication for thyroidectomy.  Patient is slightly hyperthyroid and has had some episodes of palpitations. I am concerned about potentiating osteoporosis with her elevated thyroid hormone dosage. I will ask Dr. Wilson Singer to consider decreasing her dosage slightly in order to get her laboratory values back in a normal range. The high dosage is not working to suppress her thyroid nodules.  Patient would like to defer surgery at this time. I will schedule her to return for physical examination in one year. We will repeat her thyroid ultrasound before that office visit.  Earnstine Regal, MD, [facility]  Primary Care Physician: No primary provider on file.

## 2013-10-31 ENCOUNTER — Encounter (INDEPENDENT_AMBULATORY_CARE_PROVIDER_SITE_OTHER): Payer: Self-pay

## 2014-03-17 ENCOUNTER — Telehealth: Payer: Self-pay | Admitting: Hematology & Oncology

## 2014-03-17 NOTE — Telephone Encounter (Signed)
Pt moved 6-26 to 7-28 has to have tuesday's

## 2014-03-21 ENCOUNTER — Other Ambulatory Visit: Payer: BC Managed Care – PPO | Admitting: Lab

## 2014-03-21 ENCOUNTER — Ambulatory Visit: Payer: BC Managed Care – PPO | Admitting: Hematology & Oncology

## 2014-04-21 ENCOUNTER — Other Ambulatory Visit: Payer: Self-pay

## 2014-04-21 DIAGNOSIS — D440 Neoplasm of uncertain behavior of thyroid gland: Secondary | ICD-10-CM

## 2014-04-22 ENCOUNTER — Encounter: Payer: Self-pay | Admitting: Hematology & Oncology

## 2014-04-22 ENCOUNTER — Ambulatory Visit (HOSPITAL_BASED_OUTPATIENT_CLINIC_OR_DEPARTMENT_OTHER): Payer: BC Managed Care – PPO | Admitting: Hematology & Oncology

## 2014-04-22 ENCOUNTER — Other Ambulatory Visit (HOSPITAL_BASED_OUTPATIENT_CLINIC_OR_DEPARTMENT_OTHER): Payer: BC Managed Care – PPO | Admitting: Lab

## 2014-04-22 ENCOUNTER — Telehealth: Payer: Self-pay | Admitting: Hematology & Oncology

## 2014-04-22 VITALS — BP 136/60 | HR 65 | Temp 98.0°F | Resp 18 | Wt 126.0 lb

## 2014-04-22 DIAGNOSIS — D440 Neoplasm of uncertain behavior of thyroid gland: Secondary | ICD-10-CM

## 2014-04-22 DIAGNOSIS — C50912 Malignant neoplasm of unspecified site of left female breast: Secondary | ICD-10-CM

## 2014-04-22 DIAGNOSIS — T386X5A Adverse effect of antigonadotrophins, antiestrogens, antiandrogens, not elsewhere classified, initial encounter: Principal | ICD-10-CM

## 2014-04-22 DIAGNOSIS — M818 Other osteoporosis without current pathological fracture: Secondary | ICD-10-CM

## 2014-04-22 DIAGNOSIS — Z853 Personal history of malignant neoplasm of breast: Secondary | ICD-10-CM

## 2014-04-22 DIAGNOSIS — C50919 Malignant neoplasm of unspecified site of unspecified female breast: Secondary | ICD-10-CM

## 2014-04-22 HISTORY — DX: Malignant neoplasm of unspecified site of unspecified female breast: C50.919

## 2014-04-22 LAB — CBC WITH DIFFERENTIAL (CANCER CENTER ONLY)
BASO#: 0 10*3/uL (ref 0.0–0.2)
BASO%: 0.4 % (ref 0.0–2.0)
EOS ABS: 0.4 10*3/uL (ref 0.0–0.5)
EOS%: 7.4 % — ABNORMAL HIGH (ref 0.0–7.0)
HEMATOCRIT: 39 % (ref 34.8–46.6)
HEMOGLOBIN: 12.9 g/dL (ref 11.6–15.9)
LYMPH#: 1.2 10*3/uL (ref 0.9–3.3)
LYMPH%: 24 % (ref 14.0–48.0)
MCH: 30.8 pg (ref 26.0–34.0)
MCHC: 33.1 g/dL (ref 32.0–36.0)
MCV: 93 fL (ref 81–101)
MONO#: 0.4 10*3/uL (ref 0.1–0.9)
MONO%: 8.2 % (ref 0.0–13.0)
NEUT#: 3.1 10*3/uL (ref 1.5–6.5)
NEUT%: 60 % (ref 39.6–80.0)
Platelets: 207 10*3/uL (ref 145–400)
RBC: 4.19 10*6/uL (ref 3.70–5.32)
RDW: 11.9 % (ref 11.1–15.7)
WBC: 5.1 10*3/uL (ref 3.9–10.0)

## 2014-04-22 NOTE — Telephone Encounter (Signed)
Pt aware of 8-7 bone density

## 2014-04-23 ENCOUNTER — Telehealth: Payer: Self-pay | Admitting: Nurse Practitioner

## 2014-04-23 LAB — COMPREHENSIVE METABOLIC PANEL
ALT: 13 U/L (ref 0–35)
AST: 17 U/L (ref 0–37)
Albumin: 4.2 g/dL (ref 3.5–5.2)
Alkaline Phosphatase: 69 U/L (ref 39–117)
BILIRUBIN TOTAL: 0.6 mg/dL (ref 0.2–1.2)
BUN: 16 mg/dL (ref 6–23)
CO2: 29 meq/L (ref 19–32)
CREATININE: 0.67 mg/dL (ref 0.50–1.10)
Calcium: 9.2 mg/dL (ref 8.4–10.5)
Chloride: 107 mEq/L (ref 96–112)
GLUCOSE: 90 mg/dL (ref 70–99)
Potassium: 4.7 mEq/L (ref 3.5–5.3)
Sodium: 144 mEq/L (ref 135–145)
Total Protein: 6.3 g/dL (ref 6.0–8.3)

## 2014-04-23 LAB — LACTATE DEHYDROGENASE: LDH: 187 U/L (ref 94–250)

## 2014-04-23 LAB — VITAMIN D 25 HYDROXY (VIT D DEFICIENCY, FRACTURES): VIT D 25 HYDROXY: 45 ng/mL (ref 30–89)

## 2014-04-23 NOTE — Progress Notes (Signed)
Hematology and Oncology Follow Up Visit  Kristen Lynch [id number] 29-Aug-1951 63 y.o. 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  Lynch is back for followup. We see her yearly. She now was out from a her treatment I 13 years. She received adjuvant chemotherapy with Taxotere/Adriamycin/Cytoxan. She had 4 cycles. She then had radiation therapy. She was then placed on Arimidex. She lost her husband 15 years ago. She's been doing okay overall.  Unfortunately , a relative has breast cancer. She lives in [facility] had no problems with her bones. She is getting in the sun. She has not had any problems with skin cancer.  She's had no change in bowel or bladder habits.  Her last mammogram was in December.  She's not sure when her last bone density test was. I think it probably was about 5 or 6 years ago.          Medications: Current outpatient prescriptions:calcium citrate-vitamin D (CITRACAL+D) 315-200 MG-UNIT per tablet, Take 1 tablet by mouth 2 (two) times daily., Disp: , Rfl: ;  levothyroxine (SYNTHROID) 100 MCG tablet, Take 100 mcg by mouth daily., Disp: , Rfl: ;  rosuvastatin (CRESTOR) 10 MG tablet, Take 10 mg by mouth daily., Disp: , Rfl:   Allergies: No Known Allergies  Past Medical History, Surgical history, Social history, and Family History were reviewed and updated.  Review of Systems: As above  Physical Exam:  weight is 126 lb (57.153 kg). Her temperature is 98 F (36.7 C). Her blood pressure is 136/60 and her pulse is 65. Her respiration is 18.   Thin but well-nourished white female. Lungs are clear. Cardiac exam regular in rhythm. Breast exam shows right breast with no masses edema or erythema. There is no right axillary adenopathy. Left breast shows a well-healed lobectomy at the 2:00 position. There is some contraction of the  left breast. There is no mass in the left breast. There is no left axillary adenopathy. Abdomen is soft. There is no fluid wave. There is no palpable liver or spleen to. Exam shows no tenderness over the spine ribs or hips. Extremities shows no clubbing cyanosis or edema. Skin exam shows no suspicious skin lesions.  Lab Results  Component Value Date   WBC 5.1 04/22/2014   HGB 12.9 04/22/2014   HCT 39.0 04/22/2014   MCV 93 04/22/2014   PLT 207 04/22/2014     Chemistry      Component Value Date/Time   NA 144 04/22/2014 0844   K 4.7 04/22/2014 0844   CL 107 04/22/2014 0844   CO2 29 04/22/2014 0844   BUN 16 04/22/2014 0844   CREATININE 0.67 04/22/2014 0844      Component Value Date/Time   CALCIUM 9.2 04/22/2014 0844   ALKPHOS 69 04/22/2014 0844   AST 17 04/22/2014 0844   ALT 13 04/22/2014 0844   BILITOT 0.6 04/22/2014 0844         Impression and Plan: Kristen Lynch is 63 year old female. She is now 13 years from breast cancer. I really think that she is cured.  She she likes to see [place] yearly.  We will see what her vitamin D level is. I told her to take 2000 units of vitamin D daily. I also told her to take a baby aspirin a day.  I told her that if we could  help out with her cousin, I would be more than happy to. I think she is seeing a Garment/textile technologist in town. Kristen Napoleon, MD 7/29/20156:59 PM

## 2014-04-23 NOTE — Telephone Encounter (Addendum)
Message copied by Jimmy Footman on Wed Apr 23, 2014  9:52 AM ------      Message from: Volanda Napoleon      Created: Tue Apr 22, 2014 10:23 PM       Call - all labs are ok!!! Pete ------Pt verbalized understanding and appreciation.

## 2014-04-28 ENCOUNTER — Other Ambulatory Visit: Payer: Self-pay

## 2014-04-28 DIAGNOSIS — Z1231 Encounter for screening mammogram for malignant neoplasm of breast: Secondary | ICD-10-CM

## 2014-05-02 ENCOUNTER — Other Ambulatory Visit: Payer: BC Managed Care – PPO

## 2014-07-31 ENCOUNTER — Other Ambulatory Visit (INDEPENDENT_AMBULATORY_CARE_PROVIDER_SITE_OTHER): Payer: Self-pay

## 2014-07-31 DIAGNOSIS — E042 Nontoxic multinodular goiter: Secondary | ICD-10-CM

## 2014-08-18 ENCOUNTER — Ambulatory Visit
Admission: RE | Admit: 2014-08-18 | Discharge: 2014-08-18 | Disposition: A | Discharge status: Home / Self Care | Payer: BC Managed Care – PPO | Source: Ambulatory Visit | Attending: Surgery | Admitting: Surgery

## 2014-08-18 DIAGNOSIS — E042 Nontoxic multinodular goiter: Secondary | ICD-10-CM

## 2014-09-04 ENCOUNTER — Ambulatory Visit
Admission: RE | Admit: 2014-09-04 | Discharge: 2014-09-04 | Disposition: A | Discharge status: Home / Self Care | Payer: BC Managed Care – PPO | Source: Ambulatory Visit

## 2014-09-04 ENCOUNTER — Ambulatory Visit
Admission: RE | Admit: 2014-09-04 | Discharge: 2014-09-04 | Disposition: A | Discharge status: Home / Self Care | Payer: BC Managed Care – PPO | Source: Ambulatory Visit | Attending: Hematology & Oncology | Admitting: Hematology & Oncology

## 2014-09-04 DIAGNOSIS — Z1231 Encounter for screening mammogram for malignant neoplasm of breast: Secondary | ICD-10-CM

## 2014-09-04 DIAGNOSIS — D440 Neoplasm of uncertain behavior of thyroid gland: Secondary | ICD-10-CM

## 2014-09-04 DIAGNOSIS — M818 Other osteoporosis without current pathological fracture: Secondary | ICD-10-CM

## 2014-09-04 DIAGNOSIS — T386X5A Adverse effect of antigonadotrophins, antiestrogens, antiandrogens, not elsewhere classified, initial encounter: Principal | ICD-10-CM

## 2014-09-09 ENCOUNTER — Telehealth: Payer: Self-pay | Admitting: Nurse Practitioner

## 2014-09-09 NOTE — Telephone Encounter (Addendum)
-----   Message from Volanda Napoleon, MD sent at 09/05/2014  5:35 PM EST ----- Please call her and let her know that she has some osteopenia. What is she taking for this aside vitamin D??? Pete  Pt verbalized understanding. She has not been taking the 1000mg  of Calcium as advised. Will begin taking the Ca with Vit D to facilitate bone improvement.

## 2014-09-29 ENCOUNTER — Encounter: Payer: Self-pay | Admitting: Hematology & Oncology

## 2015-04-23 ENCOUNTER — Ambulatory Visit (HOSPITAL_BASED_OUTPATIENT_CLINIC_OR_DEPARTMENT_OTHER): Payer: 59 | Admitting: Hematology & Oncology

## 2015-04-23 ENCOUNTER — Other Ambulatory Visit (HOSPITAL_BASED_OUTPATIENT_CLINIC_OR_DEPARTMENT_OTHER): Payer: 59

## 2015-04-23 ENCOUNTER — Encounter: Payer: Self-pay | Admitting: Hematology & Oncology

## 2015-04-23 VITALS — BP 155/53 | HR 59 | Temp 98.1°F | Resp 14 | Ht 64.0 in | Wt 125.0 lb

## 2015-04-23 DIAGNOSIS — Z853 Personal history of malignant neoplasm of breast: Secondary | ICD-10-CM

## 2015-04-23 DIAGNOSIS — C50912 Malignant neoplasm of unspecified site of left female breast: Secondary | ICD-10-CM

## 2015-04-23 DIAGNOSIS — M8580 Other specified disorders of bone density and structure, unspecified site: Secondary | ICD-10-CM | POA: Diagnosis not present

## 2015-04-23 DIAGNOSIS — E559 Vitamin D deficiency, unspecified: Secondary | ICD-10-CM

## 2015-04-23 LAB — CBC WITH DIFFERENTIAL (CANCER CENTER ONLY)
BASO#: 0 10*3/uL (ref 0.0–0.2)
BASO%: 0.5 % (ref 0.0–2.0)
EOS%: 9.1 % — AB (ref 0.0–7.0)
Eosinophils Absolute: 0.5 10*3/uL (ref 0.0–0.5)
HCT: 37.4 % (ref 34.8–46.6)
HEMOGLOBIN: 12.4 g/dL (ref 11.6–15.9)
LYMPH#: 1.6 10*3/uL (ref 0.9–3.3)
LYMPH%: 28.5 % (ref 14.0–48.0)
MCH: 31.4 pg (ref 26.0–34.0)
MCHC: 33.2 g/dL (ref 32.0–36.0)
MCV: 95 fL (ref 81–101)
MONO#: 0.5 10*3/uL (ref 0.1–0.9)
MONO%: 9 % (ref 0.0–13.0)
NEUT#: 2.9 10*3/uL (ref 1.5–6.5)
NEUT%: 52.9 % (ref 39.6–80.0)
Platelets: 187 10*3/uL (ref 145–400)
RBC: 3.95 10*6/uL (ref 3.70–5.32)
RDW: 12.2 % (ref 11.1–15.7)
WBC: 5.5 10*3/uL (ref 3.9–10.0)

## 2015-04-23 LAB — COMPREHENSIVE METABOLIC PANEL
ALBUMIN: 4 g/dL (ref 3.6–5.1)
ALK PHOS: 60 U/L (ref 33–130)
ALT: 11 U/L (ref 6–29)
AST: 16 U/L (ref 10–35)
BILIRUBIN TOTAL: 0.6 mg/dL (ref 0.2–1.2)
BUN: 22 mg/dL (ref 7–25)
CHLORIDE: 106 meq/L (ref 98–110)
CO2: 30 meq/L (ref 20–31)
Calcium: 9.8 mg/dL (ref 8.6–10.4)
Creatinine, Ser: 0.69 mg/dL (ref 0.50–0.99)
Glucose, Bld: 106 mg/dL — ABNORMAL HIGH (ref 65–99)
POTASSIUM: 4.5 meq/L (ref 3.5–5.3)
SODIUM: 147 meq/L — AB (ref 135–146)
TOTAL PROTEIN: 6.2 g/dL (ref 6.1–8.1)

## 2015-04-23 LAB — VITAMIN D 25 HYDROXY (VIT D DEFICIENCY, FRACTURES): Vit D, 25-Hydroxy: 50 ng/mL (ref 30–100)

## 2015-04-23 NOTE — Progress Notes (Signed)
Hematology and Oncology Follow Up Visit  Kristen Lynch [id number] [birth year] 64 y.o. 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 We see her yearly. She now was out from a her treatment by 14 years. She received adjuvant chemotherapy with Taxotere/Adriamycin/Cytoxan. She had 4 cycles. She then had radiation therapy. She was then placed on Arimidex. She lost her husband 15 years ago. She's been doing okay overall.  She had a bone density test done back in December. This did show osteopenia. She is taking vitamin D.  She is on baby aspirin. She also is taking cholesterol medicine.   Her last mammogram was also in December. Everything looked fine on the mammogram.  She's had no change in bowel or bladder habits. She's had no cough. She's had no leg swelling. She's had no rashes. She just got back from the beach on vacation. She had a good time.  Overall, her performance status is ECOG 1.    Medications:  Current outpatient prescriptions:  .  calcium carbonate (OS-CAL) 600 MG TABS tablet, Take 600 mg by mouth 2 (two) times daily with a meal., Disp: , Rfl:  .  calcium citrate-vitamin D (CITRACAL+D) 315-200 MG-UNIT per tablet, Take 1 tablet by mouth 2 (two) times daily., Disp: , Rfl:  .  cholecalciferol (VITAMIN D) 1000 UNITS tablet, Take 1,000 Units by mouth daily. 2,000 Units daily, Disp: , Rfl:  .  rosuvastatin (CRESTOR) 10 MG tablet, Take 10 mg by mouth daily., Disp: , Rfl:  .  SYNTHROID 88 MCG tablet, Take 88 mcg by mouth daily., Disp: , Rfl: 1  Allergies: No Known Allergies  Past Medical History, Surgical history, Social history, and Family History were reviewed and updated.  Review of Systems: As above  Physical Exam:  height is 5\' 4"  (1.626 m) and weight is 125 lb (56.7 kg). Her oral temperature is 98.1 F (36.7  C). Her blood pressure is 155/53 and her pulse is 59. Her respiration is 14.   Thin but well-nourished white female. Lungs are clear. Cardiac exam regular rate and rhythm with no murmurs, rubs or bruits.. Breast exam shows right breast with no masses edema or erythema. There is no right axillary adenopathy. Left breast shows a well-healed lobectomy at the 2:00 position. There is some contraction of the left breast. There is no mass in the left breast. There is no left axillary adenopathy. Abdomen is soft. There is no fluid wave. There is no palpable liver or spleen to. Exam shows no tenderness over the spine ribs or hips. Extremities shows no clubbing cyanosis or edema. Skin exam shows no suspicious skin lesions.  Lab Results  Component Value Date   WBC 5.5 04/23/2015   HGB 12.4 04/23/2015   HCT 37.4 04/23/2015   MCV 95 04/23/2015   PLT 187 04/23/2015     Chemistry      Component Value Date/Time   NA 144 04/22/2014 0844   K 4.7 04/22/2014 0844   CL 107 04/22/2014 0844   CO2 29 04/22/2014 0844   BUN 16 04/22/2014 0844   CREATININE 0.67 04/22/2014 0844      Component Value Date/Time   CALCIUM 9.2 04/22/2014 0844   ALKPHOS 69 04/22/2014 0844   AST 17 04/22/2014 0844   ALT 13 04/22/2014 0844   BILITOT 0.6 04/22/2014  0844         Impression and Plan: Kristen Lynch is 64 year old female. She is now 13 years from breast cancer. I really think that she is cured.  She she likes to see [place] yearly.   Volanda Napoleon, MD 7/28/201610:07 AM

## 2015-04-27 ENCOUNTER — Telehealth: Payer: Self-pay | Admitting: *Deleted

## 2015-04-27 NOTE — Telephone Encounter (Addendum)
Patient aware of results.  ----- Message from Volanda Napoleon, MD sent at 04/23/2015  4:55 PM EDT ----- Call and tell her that labs and vitamin D level looked fantastic. Thanks

## 2015-07-14 ENCOUNTER — Other Ambulatory Visit: Payer: Self-pay | Admitting: Surgery

## 2015-07-14 DIAGNOSIS — E041 Nontoxic single thyroid nodule: Secondary | ICD-10-CM

## 2015-08-05 ENCOUNTER — Other Ambulatory Visit: Payer: Self-pay

## 2015-08-05 DIAGNOSIS — Z1231 Encounter for screening mammogram for malignant neoplasm of breast: Secondary | ICD-10-CM

## 2015-08-31 ENCOUNTER — Other Ambulatory Visit: Payer: Self-pay | Admitting: Endocrinology

## 2015-08-31 DIAGNOSIS — E041 Nontoxic single thyroid nodule: Secondary | ICD-10-CM

## 2015-09-07 ENCOUNTER — Ambulatory Visit
Admission: RE | Admit: 2015-09-07 | Discharge: 2015-09-07 | Disposition: A | Discharge status: Home / Self Care | Payer: 59 | Source: Ambulatory Visit | Attending: Surgery | Admitting: Surgery

## 2015-09-07 DIAGNOSIS — E041 Nontoxic single thyroid nodule: Secondary | ICD-10-CM

## 2015-09-08 ENCOUNTER — Ambulatory Visit
Admission: RE | Admit: 2015-09-08 | Discharge: 2015-09-08 | Disposition: A | Discharge status: Home / Self Care | Payer: 59 | Source: Ambulatory Visit

## 2015-09-08 DIAGNOSIS — Z1231 Encounter for screening mammogram for malignant neoplasm of breast: Secondary | ICD-10-CM

## 2015-09-17 ENCOUNTER — Other Ambulatory Visit: Payer: 59

## 2016-03-23 ENCOUNTER — Other Ambulatory Visit: Payer: Self-pay | Admitting: Endocrinology

## 2016-03-23 DIAGNOSIS — Z8639 Personal history of other endocrine, nutritional and metabolic disease: Secondary | ICD-10-CM

## 2016-04-06 ENCOUNTER — Encounter: Payer: Self-pay | Admitting: Hematology & Oncology

## 2016-04-06 ENCOUNTER — Other Ambulatory Visit (HOSPITAL_BASED_OUTPATIENT_CLINIC_OR_DEPARTMENT_OTHER): Payer: BLUE CROSS/BLUE SHIELD

## 2016-04-06 ENCOUNTER — Ambulatory Visit (HOSPITAL_BASED_OUTPATIENT_CLINIC_OR_DEPARTMENT_OTHER): Payer: BLUE CROSS/BLUE SHIELD | Admitting: Hematology & Oncology

## 2016-04-06 VITALS — BP 150/60 | HR 69 | Temp 98.0°F | Resp 18 | Ht 64.0 in | Wt 129.0 lb

## 2016-04-06 DIAGNOSIS — Z853 Personal history of malignant neoplasm of breast: Secondary | ICD-10-CM

## 2016-04-06 DIAGNOSIS — C50912 Malignant neoplasm of unspecified site of left female breast: Secondary | ICD-10-CM

## 2016-04-06 DIAGNOSIS — M810 Age-related osteoporosis without current pathological fracture: Secondary | ICD-10-CM

## 2016-04-06 DIAGNOSIS — E559 Vitamin D deficiency, unspecified: Secondary | ICD-10-CM

## 2016-04-06 LAB — COMPREHENSIVE METABOLIC PANEL
ALT: 18 U/L (ref 0–55)
ANION GAP: 8 meq/L (ref 3–11)
AST: 22 U/L (ref 5–34)
Albumin: 3.8 g/dL (ref 3.5–5.0)
Alkaline Phosphatase: 70 U/L (ref 40–150)
BILIRUBIN TOTAL: 0.62 mg/dL (ref 0.20–1.20)
BUN: 23 mg/dL (ref 7.0–26.0)
CHLORIDE: 107 meq/L (ref 98–109)
CO2: 29 meq/L (ref 22–29)
Calcium: 9.2 mg/dL (ref 8.4–10.4)
Creatinine: 0.8 mg/dL (ref 0.6–1.1)
EGFR: 78 mL/min/{1.73_m2} — AB (ref >90)
Glucose: 69 mg/dl — ABNORMAL LOW (ref 70–140)
POTASSIUM: 4.2 meq/L (ref 3.5–5.1)
SODIUM: 144 meq/L (ref 136–145)
TOTAL PROTEIN: 6.8 g/dL (ref 6.4–8.3)

## 2016-04-06 LAB — CBC WITH DIFFERENTIAL (CANCER CENTER ONLY)
BASO#: 0 10*3/uL (ref 0.0–0.2)
BASO%: 0.5 % (ref 0.0–2.0)
EOS ABS: 0.3 10*3/uL (ref 0.0–0.5)
EOS%: 4.9 % (ref 0.0–7.0)
HCT: 38.1 % (ref 34.8–46.6)
HGB: 12.4 g/dL (ref 11.6–15.9)
LYMPH#: 1.4 10*3/uL (ref 0.9–3.3)
LYMPH%: 23.5 % (ref 14.0–48.0)
MCH: 31.1 pg (ref 26.0–34.0)
MCHC: 32.5 g/dL (ref 32.0–36.0)
MCV: 96 fL (ref 81–101)
MONO#: 0.5 10*3/uL (ref 0.1–0.9)
MONO%: 8.9 % (ref 0.0–13.0)
NEUT#: 3.7 10*3/uL (ref 1.5–6.5)
NEUT%: 62.2 % (ref 39.6–80.0)
PLATELETS: 198 10*3/uL (ref 145–400)
RBC: 3.99 10*6/uL (ref 3.70–5.32)
RDW: 12.3 % (ref 11.1–15.7)
WBC: 6 10*3/uL (ref 3.9–10.0)

## 2016-04-06 NOTE — Progress Notes (Signed)
Hematology and Oncology Follow Up Visit  Kristen Lynch:[id number] 03-Dec-1950 65 y.o. 04/06/2016   Principle Diagnosis:   Stage IIA (T2N0M0) infiltrating ductal carcinoma of the left breast- ER positive  Current Therapy:    Observation     Interim History:  Ms.  Lynch is back for followup. We see Kristen yearly. She is very tan. She and Kristen family went to the beach back in June. She had a good time.  She is helping take care of Kristen Lynch. Kristen Lynch is a 39 is old. Kristen Lynch fracture Kristen femur and recurrent surgery. She is doing better now.  She's had no change in bowel or bladder habits. She's had no cough. She's had no rashes. She's had no bleeding.   She now was out from a Kristen treatment by 15 years. She received adjuvant chemotherapy with Kristen Lynch/Kristen Lynch/Kristen Lynch. She had 4 cycles. She then had radiation therapy. She was then placed on Kristen Lynch.  She lost Kristen husband 16 years ago. She's been doing okay overall.   She is on baby aspirin. She also is taking cholesterol medicine.   Kristen last mammogram was also in December. Everything looked fine on the mammogram.  She's had no change in bowel or bladder habits. She's had no cough. She's had no leg swelling. She's had no rashes. She just got back from the beach on vacation. She had a good time.  Overall, Kristen performance status is Kristen Lynch.    Medications:  Current outpatient prescriptions:  .  aspirin 81 MG tablet, Take 81 mg by mouth daily., Disp: , Rfl:  .  calcium carbonate (OS-CAL) 600 MG TABS tablet, Take 600 mg by mouth 2 (two) times daily with a meal., Disp: , Rfl:  .  calcium citrate-vitamin D (CITRACAL+D) 315-200 MG-UNIT per tablet, Take Lynch tablet by mouth 2 (two) times daily., Disp: , Rfl:  .  cholecalciferol (VITAMIN D) 1000 UNITS tablet, Take 5,000 Units by mouth daily. 5,000 Units daily, Disp: , Rfl:  .  rosuvastatin (CRESTOR) 10 MG tablet, Take 10 mg by mouth daily., Disp: , Rfl:  .  SYNTHROID 88 MCG tablet, Take 88 mcg by  mouth daily., Disp: , Rfl: Lynch  Allergies: No Known Allergies  Past Medical History, Surgical history, Social history, and Family History were reviewed and updated.  Review of Systems: As above  Physical Exam:  height is 5\' 4"  (Lynch.626 m) and weight is 129 lb (58.514 kg). Kristen oral temperature is 98 F (36.7 C). Kristen blood pressure is 150/60 and Kristen pulse is 69. Kristen respiration is 18.   Thin but well-nourished white female. Head and neck exam shows no ocular or oral lesions. She has no palpable cervical or supraclavicular lymph nodes. She does have a palpable thyroid nodule in the left lobe of the thyroid. Lungs are clear. Cardiac exam regular rate and rhythm with no murmurs, rubs or bruits.. Breast exam shows right breast with no masses edema or erythema. There is no right axillary adenopathy. Left breast shows a well-healed lobectomy at the 2:00 position. There is some contraction of the left breast. There is no mass in the left breast. There is no left axillary adenopathy. Abdomen is soft. There is no fluid wave. There is no palpable liver or spleen to. Exam shows no tenderness over the spine ribs or hips. Extremities shows no clubbing cyanosis or edema. Skin exam shows no suspicious skin lesions.  Lab Results  Component Value Date   WBC 6.0 04/06/2016  HGB 12.4 04/06/2016   HCT 38.Lynch 04/06/2016   MCV 96 04/06/2016   PLT 198 04/06/2016     Chemistry      Component Value Date/Time   NA 147* 04/23/2015 0854   K 4.5 04/23/2015 0854   CL 106 04/23/2015 0854   CO2 30 04/23/2015 0854   BUN 22 04/23/2015 0854   CREATININE 0.69 04/23/2015 0854      Component Value Date/Time   CALCIUM 9.8 04/23/2015 0854   ALKPHOS 60 04/23/2015 0854   AST 16 04/23/2015 0854   ALT 11 04/23/2015 0854   BILITOT 0.6 04/23/2015 0854         Impression and Plan: Kristen Lynch is 65 year old female. She is now 15 years from breast cancer. I really think that she is cured.  She is a little worried about the  cancer coming back. She read about what happened to Kristen Lynch. I told Kristen that the fact that this happened to Kristen Lynch is incredibly rare. I think the chance of this happening for Kristen Lynch is less than 5% easily.  I told Kristen to make sure that she wears sunscreen. She clearly is at risk for skin cancer because of Kristen skin type.  She she likes to see [place] yearly.   Kristen Napoleon, MD 7/12/201711:54 AM

## 2016-04-07 LAB — VITAMIN D 25 HYDROXY (VIT D DEFICIENCY, FRACTURES): VIT D 25 HYDROXY: 75.5 ng/mL (ref 30.0–100.0)

## 2016-04-18 ENCOUNTER — Ambulatory Visit: Payer: Self-pay | Admitting: Hematology & Oncology

## 2016-04-18 ENCOUNTER — Other Ambulatory Visit: Payer: 59

## 2016-04-22 ENCOUNTER — Ambulatory Visit: Payer: 59 | Admitting: Hematology & Oncology

## 2016-04-22 ENCOUNTER — Other Ambulatory Visit: Payer: 59

## 2016-05-12 ENCOUNTER — Other Ambulatory Visit (HOSPITAL_COMMUNITY): Payer: Self-pay | Admitting: Endocrinology

## 2016-05-12 DIAGNOSIS — E0590 Thyrotoxicosis, unspecified without thyrotoxic crisis or storm: Secondary | ICD-10-CM

## 2016-06-06 ENCOUNTER — Encounter (HOSPITAL_COMMUNITY): Payer: Self-pay

## 2016-06-06 ENCOUNTER — Encounter (HOSPITAL_COMMUNITY)
Admission: RE | Admit: 2016-06-06 | Discharge: 2016-06-06 | Disposition: A | Discharge status: Home / Self Care | Payer: BLUE CROSS/BLUE SHIELD | Source: Ambulatory Visit | Attending: Endocrinology | Admitting: Endocrinology

## 2016-06-06 ENCOUNTER — Encounter (HOSPITAL_COMMUNITY): Payer: BLUE CROSS/BLUE SHIELD

## 2016-06-06 DIAGNOSIS — E0590 Thyrotoxicosis, unspecified without thyrotoxic crisis or storm: Secondary | ICD-10-CM | POA: Insufficient documentation

## 2016-06-07 ENCOUNTER — Encounter (HOSPITAL_COMMUNITY): Payer: BLUE CROSS/BLUE SHIELD

## 2016-06-07 ENCOUNTER — Other Ambulatory Visit (HOSPITAL_COMMUNITY): Payer: Self-pay | Admitting: Endocrinology

## 2016-06-07 DIAGNOSIS — E0590 Thyrotoxicosis, unspecified without thyrotoxic crisis or storm: Secondary | ICD-10-CM

## 2016-06-10 ENCOUNTER — Ambulatory Visit
Admission: RE | Admit: 2016-06-10 | Discharge: 2016-06-10 | Disposition: A | Discharge status: Home / Self Care | Payer: BLUE CROSS/BLUE SHIELD | Source: Ambulatory Visit | Attending: Endocrinology | Admitting: Endocrinology

## 2016-06-10 DIAGNOSIS — Z8639 Personal history of other endocrine, nutritional and metabolic disease: Secondary | ICD-10-CM

## 2016-07-12 ENCOUNTER — Other Ambulatory Visit: Payer: Self-pay

## 2016-07-12 DIAGNOSIS — Z1211 Encounter for screening for malignant neoplasm of colon: Secondary | ICD-10-CM

## 2016-07-20 ENCOUNTER — Encounter (HOSPITAL_COMMUNITY)
Admission: RE | Admit: 2016-07-20 | Discharge: 2016-07-20 | Disposition: A | Discharge status: Home / Self Care | Payer: BLUE CROSS/BLUE SHIELD | Source: Ambulatory Visit | Attending: Endocrinology | Admitting: Endocrinology

## 2016-07-20 DIAGNOSIS — E0590 Thyrotoxicosis, unspecified without thyrotoxic crisis or storm: Secondary | ICD-10-CM | POA: Insufficient documentation

## 2016-07-21 ENCOUNTER — Encounter (HOSPITAL_COMMUNITY)
Admission: RE | Admit: 2016-07-21 | Discharge: 2016-07-21 | Disposition: A | Discharge status: Home / Self Care | Payer: BLUE CROSS/BLUE SHIELD | Source: Ambulatory Visit | Attending: Endocrinology | Admitting: Endocrinology

## 2016-07-21 MED ORDER — SODIUM PERTECHNETATE TC 99M INJECTION
10.0000 | Freq: Once | INTRAVENOUS | Status: AC | PRN
  Administered 2016-07-21: 10 via INTRAVENOUS

## 2016-07-21 MED ORDER — SODIUM IODIDE I 131 CAPSULE
7.0000 | Freq: Once | INTRAVENOUS | Status: AC | PRN
  Administered 2016-07-21: 7 via ORAL

## 2016-08-01 ENCOUNTER — Other Ambulatory Visit: Payer: Self-pay | Admitting: Endocrinology

## 2016-08-01 DIAGNOSIS — Z1231 Encounter for screening mammogram for malignant neoplasm of breast: Secondary | ICD-10-CM

## 2016-08-02 ENCOUNTER — Other Ambulatory Visit: Payer: Self-pay | Admitting: Endocrinology

## 2016-08-02 DIAGNOSIS — E041 Nontoxic single thyroid nodule: Secondary | ICD-10-CM

## 2016-08-29 ENCOUNTER — Encounter: Payer: Self-pay | Admitting: Internal Medicine

## 2016-08-31 ENCOUNTER — Encounter: Payer: Self-pay | Admitting: Internal Medicine

## 2016-08-31 NOTE — Patient Instructions (Signed)
  Patient to have recall screening colonoscopy.  Patient instructed and consent signed.

## 2016-09-02 ENCOUNTER — Encounter: Payer: Self-pay | Admitting: Internal Medicine

## 2016-09-02 ENCOUNTER — Ambulatory Visit (AMBULATORY_SURGERY_CENTER): Payer: BLUE CROSS/BLUE SHIELD | Admitting: Internal Medicine

## 2016-09-02 VITALS — BP 137/58 | HR 65 | Temp 97.7°F | Resp 19 | Ht 64.0 in | Wt 129.0 lb

## 2016-09-02 DIAGNOSIS — Z1212 Encounter for screening for malignant neoplasm of rectum: Secondary | ICD-10-CM

## 2016-09-02 DIAGNOSIS — Z1211 Encounter for screening for malignant neoplasm of colon: Secondary | ICD-10-CM

## 2016-09-02 MED ORDER — SODIUM CHLORIDE 0.9 % IV SOLN: 500.0000 mL | INTRAVENOUS | Status: AC

## 2016-09-02 NOTE — Progress Notes (Signed)
To recovery, report to Scott, RN, VSS 

## 2016-09-02 NOTE — Op Note (Signed)
[facility] Patient Name: [name] Procedure Date: 09/02/2016 10:31 AM MRN: [id number] Endoscopist: Gatha Mayer , MD Age: 65 Referring MD:  Date of Birth: December 04, 1950 Gender: Female Account #: [id number] Procedure:                Colonoscopy Indications:              Screening for colorectal malignant neoplasm, Last                            colonoscopy: 2007 Medicines:                Propofol per Anesthesia, Monitored Anesthesia Care Procedure:                Pre-Anesthesia Assessment:                           - Prior to the procedure, a History and Physical                            was performed, and patient medications and                            allergies were reviewed. The patient's tolerance of                            previous anesthesia was also reviewed. The risks                            and benefits of the procedure and the sedation                            options and risks were discussed with the patient.                            All questions were answered, and informed consent                            was obtained. Prior Anticoagulants: The patient                            last took aspirin 5 days prior to the procedure.                            ASA Grade Assessment: II - A patient with mild                            systemic disease. After reviewing the risks and                            benefits, the patient was deemed in satisfactory                            condition to undergo the procedure.  After obtaining informed consent, the colonoscope                            was passed under direct vision. Throughout the                            procedure, the patient's blood pressure, pulse, and                            oxygen saturations were monitored continuously. The                            Model PCF-H190L [id number] scope was introduced                            through the anus and advanced  to the the cecum,                            identified by appendiceal orifice and ileocecal                            valve. The colonoscopy was performed without                            difficulty. The patient tolerated the procedure                            well. The quality of the bowel preparation was                            good. The bowel preparation used was Miralax. The                            ileocecal valve, appendiceal orifice, and rectum                            were photographed. Scope In: 10:48:46 AM Scope Out: [id number] AM Scope Withdrawal Time: 0 hours 13 minutes 26 seconds  Total Procedure Duration: 0 hours 17 minutes 58 seconds  Findings:                 The perianal and digital rectal examinations were                            normal.                           Multiple small-mouthed diverticula were found in                            the sigmoid colon.                           External and internal hemorrhoids were found during  retroflexion. The hemorrhoids were small.                           The exam was otherwise without abnormality on                            direct and retroflexion views. Complications:            No immediate complications. Estimated Blood Loss:     Estimated blood loss: none. Impression:               - Diverticulosis in the sigmoid colon.                           - External and internal hemorrhoids.                           - The examination was otherwise normal on direct                            and retroflexion views.                           - No specimens collected. Recommendation:           - Patient has a contact number available for                            emergencies. The signs and symptoms of potential                            delayed complications were discussed with the                            patient. Return to normal activities tomorrow.                             Written discharge instructions were provided to the                            patient.                           - Resume previous diet.                           - Continue present medications.                           - Repeat colonoscopy in 10 years for screening                            purposes. Gatha Mayer, MD 09/02/2016 11:14:29 AM This report has been signed electronically.

## 2016-09-02 NOTE — Patient Instructions (Addendum)
No polyps, no cancer!  You do have a condition called diverticulosis - common and not usually a problem. Please read the handout provided.  Also small hemorrhoids were seen.   Next routine colonoscopy in 10 years - 2027  I appreciate the opportunity to care for you. Gatha Mayer, MD, FACG   YOU HAD AN ENDOSCOPIC PROCEDURE TODAY AT [facility]:   Refer to the procedure report that was given to you for any specific questions about what was found during the examination.  If the procedure report does not answer your questions, please call your gastroenterologist to clarify.  If you requested that your care partner not be given the details of your procedure findings, then the procedure report has been included in a sealed envelope for you to review at your convenience later.  YOU SHOULD EXPECT: Some feelings of bloating in the abdomen. Passage of more gas than usual.  Walking can help get rid of the air that was put into your GI tract during the procedure and reduce the bloating. If you had a lower endoscopy (such as a colonoscopy or flexible sigmoidoscopy) you may notice spotting of blood in your stool or on the toilet paper. If you underwent a bowel prep for your procedure, you may not have a normal bowel movement for a few days.  Please Note:  You might notice some irritation and congestion in your nose or some drainage.  This is from the oxygen used during your procedure.  There is no need for concern and it should clear up in a day or so.  SYMPTOMS TO REPORT IMMEDIATELY:   Following lower endoscopy (colonoscopy or flexible sigmoidoscopy):  Excessive amounts of blood in the stool  Significant tenderness or worsening of abdominal pains  Swelling of the abdomen that is new, acute  Fever of 100F or higher   For urgent or emergent issues, a gastroenterologist can be reached at any hour by calling [phone].   DIET:  We do recommend a small meal at first,  but then you may proceed to your regular diet.  Drink plenty of fluids but you should avoid alcoholic beverages for 24 hours.  ACTIVITY:  You should plan to take it easy for the rest of today and you should NOT DRIVE or use heavy machinery until tomorrow (because of the sedation medicines used during the test).    FOLLOW UP: Our staff will call the number listed on your records the next business day following your procedure to check on you and address any questions or concerns that you may have regarding the information given to you following your procedure. If we do not reach you, we will leave a message.  However, if you are feeling well and you are not experiencing any problems, there is no need to return our call.  We will assume that you have returned to your regular daily activities without incident.  If any biopsies were taken you will be contacted by phone or by letter within the next 1-3 weeks.  Please call us at [phone] if you have not heard about the biopsies in 3 weeks.    SIGNATURES/CONFIDENTIALITY: You and/or your care partner have signed paperwork which will be entered into your electronic medical record.  These signatures attest to the fact that that the information above on your After Visit Summary has been reviewed and is understood.  Full responsibility of the confidentiality of this discharge information lies with you and/or your  care-partner.  Diverticulosis information given.  Recall colonoscopy 10 years-2027

## 2016-09-05 ENCOUNTER — Telehealth: Payer: Self-pay

## 2016-09-05 NOTE — Telephone Encounter (Signed)
  Follow up Call-  Call back number 09/02/2016  Post procedure Call Back phone  # [phone]  Permission to leave phone message Yes  Some recent data might be hidden     Patient questions:  Do you have a fever, pain , or abdominal swelling? No. Pain Score  0 *  Have you tolerated food without any problems? Yes.    Have you been able to return to your normal activities? Yes.    Do you have any questions about your discharge instructions: Diet   No. Medications  No. Follow up visit  No.  Do you have questions or concerns about your Care? No.  Actions: * If pain score is 4 or above: No action needed, pain <4.

## 2016-09-08 ENCOUNTER — Ambulatory Visit: Payer: BLUE CROSS/BLUE SHIELD

## 2016-09-09 ENCOUNTER — Ambulatory Visit
Admission: RE | Admit: 2016-09-09 | Discharge: 2016-09-09 | Disposition: A | Discharge status: Home / Self Care | Payer: BLUE CROSS/BLUE SHIELD | Source: Ambulatory Visit | Attending: Endocrinology | Admitting: Endocrinology

## 2016-09-09 DIAGNOSIS — Z1231 Encounter for screening mammogram for malignant neoplasm of breast: Secondary | ICD-10-CM

## 2017-01-17 ENCOUNTER — Ambulatory Visit
Admission: RE | Admit: 2017-01-17 | Discharge: 2017-01-17 | Disposition: A | Discharge status: Home / Self Care | Payer: BLUE CROSS/BLUE SHIELD | Source: Ambulatory Visit | Attending: Endocrinology | Admitting: Endocrinology

## 2017-01-17 DIAGNOSIS — E041 Nontoxic single thyroid nodule: Secondary | ICD-10-CM

## 2017-02-06 ENCOUNTER — Other Ambulatory Visit: Payer: Self-pay | Admitting: Endocrinology

## 2017-02-06 DIAGNOSIS — E041 Nontoxic single thyroid nodule: Secondary | ICD-10-CM

## 2017-04-05 ENCOUNTER — Other Ambulatory Visit (HOSPITAL_BASED_OUTPATIENT_CLINIC_OR_DEPARTMENT_OTHER): Payer: BLUE CROSS/BLUE SHIELD

## 2017-04-05 ENCOUNTER — Ambulatory Visit (HOSPITAL_BASED_OUTPATIENT_CLINIC_OR_DEPARTMENT_OTHER): Payer: BLUE CROSS/BLUE SHIELD | Admitting: Family

## 2017-04-05 VITALS — BP 143/66 | HR 81 | Temp 98.8°F | Resp 16 | Wt 124.0 lb

## 2017-04-05 DIAGNOSIS — E041 Nontoxic single thyroid nodule: Secondary | ICD-10-CM | POA: Diagnosis not present

## 2017-04-05 DIAGNOSIS — C50912 Malignant neoplasm of unspecified site of left female breast: Secondary | ICD-10-CM

## 2017-04-05 DIAGNOSIS — Z853 Personal history of malignant neoplasm of breast: Secondary | ICD-10-CM

## 2017-04-05 DIAGNOSIS — M810 Age-related osteoporosis without current pathological fracture: Secondary | ICD-10-CM

## 2017-04-05 LAB — CMP (CANCER CENTER ONLY)
ALT(SGPT): 15 U/L (ref 10–47)
AST: 25 U/L (ref 11–38)
Albumin: 3.5 g/dL (ref 3.3–5.5)
Alkaline Phosphatase: 62 U/L (ref 26–84)
BILIRUBIN TOTAL: 0.6 mg/dL (ref 0.20–1.60)
BUN, Bld: 21 mg/dL (ref 7–22)
CHLORIDE: 111 meq/L — AB (ref 98–108)
CO2: 33 meq/L (ref 18–33)
CREATININE: 0.7 mg/dL (ref 0.6–1.2)
Calcium: 9.2 mg/dL (ref 8.0–10.3)
GLUCOSE: 99 mg/dL (ref 73–118)
Potassium: 4.2 mEq/L (ref 3.3–4.7)
SODIUM: 149 meq/L — AB (ref 128–145)
Total Protein: 6.7 g/dL (ref 6.4–8.1)

## 2017-04-05 LAB — CBC WITH DIFFERENTIAL (CANCER CENTER ONLY)
BASO#: 0 10*3/uL (ref 0.0–0.2)
BASO%: 0.5 % (ref 0.0–2.0)
EOS%: 3.8 % (ref 0.0–7.0)
Eosinophils Absolute: 0.3 10*3/uL (ref 0.0–0.5)
HCT: 37 % (ref 34.8–46.6)
HGB: 12.1 g/dL (ref 11.6–15.9)
LYMPH#: 1.7 10*3/uL (ref 0.9–3.3)
LYMPH%: 25.7 % (ref 14.0–48.0)
MCH: 31.3 pg (ref 26.0–34.0)
MCHC: 32.7 g/dL (ref 32.0–36.0)
MCV: 96 fL (ref 81–101)
MONO#: 0.5 10*3/uL (ref 0.1–0.9)
MONO%: 8.3 % (ref 0.0–13.0)
NEUT#: 4 10*3/uL (ref 1.5–6.5)
NEUT%: 61.7 % (ref 39.6–80.0)
PLATELETS: 246 10*3/uL (ref 145–400)
RBC: 3.86 10*6/uL (ref 3.70–5.32)
RDW: 11.8 % (ref 11.1–15.7)
WBC: 6.5 10*3/uL (ref 3.9–10.0)

## 2017-04-05 NOTE — Progress Notes (Signed)
Hematology and Oncology Follow Up Visit  Kristen Lynch [id number] [birth year] 66 y.o. 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 She is doing well and has no complaints at this time. Her breast exam today was unchanged. She is having her mammogram yearly. Her last exam in December was negative.  She is staying busy caring for her mother and grandchildren.  She is active and walks daily for exercise.  She had an episodes on Sunday where she developed chills, nausea (no vomiting), light headedness and then had a regular BM. She states that her daughter gave her a zofran and she felt much better. She has had no other issues.  No fever, cough, rash, dizziness, SOB, chest pain, palpitations, abdominal pain or changes in bowel or bladder habits.  No swelling, tenderness, numbness or tingling in her extremities. No c/o pain.  She has bilateral thyroid nodules with a large nodule on the left. These are followed with [place] and she states may have the large nodule biopsied again later this year.  She has no problems swallowing. She has maintained a good appetite and is staying well hydrated. Her weight is stable.   ECOG Performance Status: 0 - Asymptomatic  Medications:  Allergies as of 04/05/2017   No Known Allergies     Medication List       Accurate as of 04/05/17 11:11 PM. Always use your most recent med list.          aspirin 81 MG tablet Take 81 mg by mouth daily.   calcium carbonate 600 MG Tabs tablet Commonly known as:  OS-CAL Take 600 mg by mouth 2 (two) times daily with a meal.   cholecalciferol 1000 units tablet Commonly known as:  VITAMIN D Take 2,000 Units by mouth daily. 2,000 Units daily   CRESTOR 10 MG tablet Generic drug:  rosuvastatin Take 10 mg by mouth daily.       Allergies: No Known  Allergies  Past Medical History, Surgical history, Social history, and Family History were reviewed and updated.  Review of Systems: All other 10 point review of systems is negative.   Physical Exam:  weight is 124 lb (56.2 kg). Her oral temperature is 98.8 F (37.1 C). Her blood pressure is 143/66 (abnormal) and her pulse is 81. Her respiration is 16 and oxygen saturation is 99%.   Wt Readings from Last 3 Encounters:  04/05/17 124 lb (56.2 kg)  09/02/16 129 lb (58.5 kg)  08/31/16 126 lb (57.2 kg)    Ocular: Sclerae unicteric, pupils equal, round and reactive to light Ear-nose-throat: Oropharynx clear, dentition fair Lymphatic: No cervical, supraclavicular or axillary adenopathy Lungs no rales or rhonchi, good excursion bilaterally Heart regular rate and rhythm, no murmur appreciated Abd soft, nontender, positive bowel sounds, no liver or spleen tip palpated on exam, no fluid wave MSK no focal spinal tenderness, no joint edema Neuro: non-focal, well-oriented, appropriate affect Breasts: Left breast lumpectomy site at the 1-2 o'clock position intact. Right breast exam negative. No mass, lesion or rash noted on exam   Lab Results  Component Value Date   WBC 6.5 04/05/2017   HGB 12.1 04/05/2017   HCT 37.0 04/05/2017   MCV 96 04/05/2017   PLT 246 04/05/2017   No results found for: FERRITIN, IRON, TIBC, UIBC, IRONPCTSAT Lab Results  Component  Value Date   RBC 3.86 04/05/2017   No results found for: KPAFRELGTCHN, LAMBDASER, KAPLAMBRATIO No results found for: IGGSERUM, IGA, IGMSERUM No results found for: Kathrynn Ducking, MSPIKE, SPEI   Chemistry      Component Value Date/Time   NA 149 (H) 04/05/2017 1525   NA 144 04/06/2016 1027   K 4.2 04/05/2017 1525   K 4.2 04/06/2016 1027   CL 111 (H) 04/05/2017 1525   CO2 33 04/05/2017 1525   CO2 29 04/06/2016 1027   BUN 21 04/05/2017 1525   BUN 23.0 04/06/2016 1027   CREATININE 0.7  04/05/2017 1525   CREATININE 0.8 04/06/2016 1027      Component Value Date/Time   CALCIUM 9.2 04/05/2017 1525   CALCIUM 9.2 04/06/2016 1027   ALKPHOS 62 04/05/2017 1525   ALKPHOS 70 04/06/2016 1027   AST 25 04/05/2017 1525   AST 22 04/06/2016 1027   ALT 15 04/05/2017 1525   ALT 18 04/06/2016 1027   BILITOT 0.60 04/05/2017 1525   BILITOT 0.62 04/06/2016 1027      Impression and Plan: Kristen Lynch is a very pleasant 66 yo caucasian female with history of stage IIA infiltrating ductal carcinoma of the left breast, ER positive. She completed treatment over 15 years ago She had a lumpectomy followed by 4 cycles of Taxotere/Adriamycin/Cytoxan and radiation. She then went on Arimidex. She has done well and so far there has been no evidence of recurrence.   She is doing well and has no complaints at this time. Her exam today was negative.  We will continue to follow along with her and plan to see her back again in another year.  Greater than 50% of her 15 minute face to face appointment was spent counseling and coordinating care.  She promises to contact our office with any questions or concerns. We can certainly see her sooner if need be.   Eliezer Bottom, NP 7/11/201811:11 PM

## 2017-04-06 ENCOUNTER — Other Ambulatory Visit: Payer: BLUE CROSS/BLUE SHIELD

## 2017-04-06 ENCOUNTER — Ambulatory Visit: Payer: BLUE CROSS/BLUE SHIELD | Admitting: Family

## 2017-04-06 LAB — VITAMIN D 25 HYDROXY (VIT D DEFICIENCY, FRACTURES): Vitamin D, 25-Hydroxy: 67.2 ng/mL (ref 30.0–100.0)

## 2017-07-24 ENCOUNTER — Ambulatory Visit
Admission: RE | Admit: 2017-07-24 | Discharge: 2017-07-24 | Disposition: A | Discharge status: Home / Self Care | Payer: BLUE CROSS/BLUE SHIELD | Source: Ambulatory Visit | Attending: Endocrinology | Admitting: Endocrinology

## 2017-07-24 DIAGNOSIS — E041 Nontoxic single thyroid nodule: Secondary | ICD-10-CM

## 2017-08-04 ENCOUNTER — Other Ambulatory Visit: Payer: Self-pay | Admitting: Internal Medicine

## 2017-08-04 DIAGNOSIS — Z1231 Encounter for screening mammogram for malignant neoplasm of breast: Secondary | ICD-10-CM

## 2017-09-11 ENCOUNTER — Ambulatory Visit
Admission: RE | Admit: 2017-09-11 | Discharge: 2017-09-11 | Disposition: A | Discharge status: Home / Self Care | Payer: BLUE CROSS/BLUE SHIELD | Source: Ambulatory Visit | Attending: Internal Medicine | Admitting: Internal Medicine

## 2017-09-11 DIAGNOSIS — Z1231 Encounter for screening mammogram for malignant neoplasm of breast: Secondary | ICD-10-CM

## 2017-09-11 HISTORY — DX: Personal history of irradiation: Z92.3

## 2017-09-11 HISTORY — DX: Malignant neoplasm of unspecified site of unspecified female breast: C50.919

## 2017-09-11 HISTORY — DX: Personal history of antineoplastic chemotherapy: Z92.21

## 2018-01-04 DIAGNOSIS — C44319 Basal cell carcinoma of skin of other parts of face: Secondary | ICD-10-CM | POA: Diagnosis not present

## 2018-01-04 DIAGNOSIS — C44310 Basal cell carcinoma of skin of unspecified parts of face: Secondary | ICD-10-CM | POA: Diagnosis not present

## 2018-04-04 ENCOUNTER — Inpatient Hospital Stay: Payer: Medicare HMO | Attending: Hematology & Oncology

## 2018-04-04 ENCOUNTER — Other Ambulatory Visit: Payer: Self-pay

## 2018-04-04 ENCOUNTER — Inpatient Hospital Stay (HOSPITAL_BASED_OUTPATIENT_CLINIC_OR_DEPARTMENT_OTHER): Payer: Medicare HMO | Admitting: Hematology & Oncology

## 2018-04-04 ENCOUNTER — Encounter: Payer: Self-pay | Admitting: Hematology & Oncology

## 2018-04-04 VITALS — BP 137/72 | HR 80 | Temp 99.6°F | Resp 20 | Wt 127.8 lb

## 2018-04-04 DIAGNOSIS — Z853 Personal history of malignant neoplasm of breast: Secondary | ICD-10-CM | POA: Insufficient documentation

## 2018-04-04 DIAGNOSIS — C50911 Malignant neoplasm of unspecified site of right female breast: Secondary | ICD-10-CM

## 2018-04-04 DIAGNOSIS — C50912 Malignant neoplasm of unspecified site of left female breast: Secondary | ICD-10-CM

## 2018-04-04 LAB — COMPREHENSIVE METABOLIC PANEL
ALT: 12 U/L (ref 0–44)
AST: 18 U/L (ref 15–41)
Albumin: 4.1 g/dL (ref 3.5–5.0)
Alkaline Phosphatase: 69 U/L (ref 38–126)
Anion gap: 8 (ref 5–15)
BUN: 24 mg/dL — AB (ref 8–23)
CHLORIDE: 105 mmol/L (ref 98–111)
CO2: 30 mmol/L (ref 22–32)
CREATININE: 0.9 mg/dL (ref 0.44–1.00)
Calcium: 9.5 mg/dL (ref 8.9–10.3)
GFR calc Af Amer: >60 mL/min (ref >60)
GFR calc non Af Amer: >60 mL/min (ref >60)
Glucose, Bld: 123 mg/dL — ABNORMAL HIGH (ref 70–99)
POTASSIUM: 3.9 mmol/L (ref 3.5–5.1)
SODIUM: 143 mmol/L (ref 135–145)
Total Bilirubin: 0.5 mg/dL (ref 0.3–1.2)
Total Protein: 6.8 g/dL (ref 6.5–8.1)

## 2018-04-04 LAB — CBC WITH DIFFERENTIAL (CANCER CENTER ONLY)
BASOS ABS: 0 10*3/uL (ref 0.0–0.1)
BASOS PCT: 0 %
EOS PCT: 3 %
Eosinophils Absolute: 0.2 10*3/uL (ref 0.0–0.5)
HCT: 38.6 % (ref 34.8–46.6)
Hemoglobin: 12.5 g/dL (ref 11.6–15.9)
Lymphocytes Relative: 23 %
Lymphs Abs: 1.5 10*3/uL (ref 0.9–3.3)
MCH: 30.6 pg (ref 26.0–34.0)
MCHC: 32.4 g/dL (ref 32.0–36.0)
MCV: 94.6 fL (ref 81.0–101.0)
Monocytes Absolute: 0.4 10*3/uL (ref 0.1–0.9)
Monocytes Relative: 7 %
Neutro Abs: 4.2 10*3/uL (ref 1.5–6.5)
Neutrophils Relative %: 67 %
PLATELETS: 192 10*3/uL (ref 145–400)
RBC: 4.08 MIL/uL (ref 3.70–5.32)
RDW: 11.8 % (ref 11.1–15.7)
WBC: 6.4 10*3/uL (ref 3.9–10.0)

## 2018-04-04 NOTE — Progress Notes (Signed)
Hematology and Oncology Follow Up Visit  Kristen Lynch [id number] 04-25-1951 67 y.o. 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  So far, everything is going pretty well.  She has had no problems with nausea or vomiting.  She is had no problems with cough or shortness of breath.  She is had no influenza.  She had her mammogram back in December.  Everything looked fine on the mammogram.  She is had no change in bowel or bladder habits.  She is had no leg swelling.  She is had no change in medications.  Overall, her performance status is ECOG 0.    Medications:  Allergies as of 04/04/2018   No Known Allergies     Medication List        Accurate as of 04/04/18  2:14 PM. Always use your most recent med list.          aspirin 81 MG tablet Take 81 mg by mouth daily.   calcium carbonate 600 MG Tabs tablet Commonly known as:  OS-CAL Take 600 mg by mouth 2 (two) times daily with a meal.   cholecalciferol 1000 units tablet Commonly known as:  VITAMIN D Take 1,000 Units by mouth daily.   CRESTOR 10 MG tablet Generic drug:  rosuvastatin Take 10 mg by mouth daily.       Allergies: No Known Allergies  Past Medical History, Surgical history, Social history, and Family History were reviewed and updated.  Review of Systems: Review of Systems  Constitutional: Negative.   HENT: Negative.   Eyes: Negative.   Respiratory: Negative.   Cardiovascular: Negative.   Gastrointestinal: Negative.   Genitourinary: Negative.   Musculoskeletal: Negative.   Skin: Negative.   Neurological: Negative.   Endo/Heme/Allergies: Negative.   Psychiatric/Behavioral: Negative.      Physical Exam:  weight is 127 lb 12.8 oz (58 kg). Her oral temperature is 99.6 F (37.6 C). Her blood pressure is 137/72 and her pulse is  80. Her respiration is 20 and oxygen saturation is 100%.   Wt Readings from Last 3 Encounters:  04/04/18 127 lb 12.8 oz (58 kg)  04/05/17 124 lb (56.2 kg)  09/02/16 129 lb (58.5 kg)    Physical Exam  Constitutional: She is oriented to person, place, and time.  HENT:  Head: Normocephalic and atraumatic.  Mouth/Throat: Oropharynx is clear and moist.  Eyes: Pupils are equal, round, and reactive to light. EOM are normal.  Neck: Normal range of motion.  Cardiovascular: Normal rate, regular rhythm and normal heart sounds.  Pulmonary/Chest: Effort normal and breath sounds normal.  Abdominal: Soft. Bowel sounds are normal.  Musculoskeletal: Normal range of motion. She exhibits no edema, tenderness or deformity.  Lymphadenopathy:    She has no cervical adenopathy.  Neurological: She is alert and oriented to person, place, and time.  Skin: Skin is warm and dry. No rash noted. No erythema.  Psychiatric: She has a normal mood and affect. Her behavior is normal. Judgment and thought content normal.  Vitals reviewed.    Lab Results  Component Value Date   WBC 6.4 04/04/2018   HGB 12.5 04/04/2018   HCT 38.6 04/04/2018   MCV 94.6 04/04/2018   PLT 192 04/04/2018   No results found for: FERRITIN, IRON, TIBC, UIBC, IRONPCTSAT Lab Results  Component Value Date  RBC 4.08 04/04/2018   No results found for: KPAFRELGTCHN, LAMBDASER, KAPLAMBRATIO No results found for: IGGSERUM, IGA, IGMSERUM No results found for: Kathrynn Ducking, MSPIKE, SPEI   Chemistry      Component Value Date/Time   NA 149 (H) 04/05/2017 1525   NA 144 04/06/2016 1027   K 4.2 04/05/2017 1525   K 4.2 04/06/2016 1027   CL 111 (H) 04/05/2017 1525   CO2 33 04/05/2017 1525   CO2 29 04/06/2016 1027   BUN 21 04/05/2017 1525   BUN 23.0 04/06/2016 1027   CREATININE 0.7 04/05/2017 1525   CREATININE 0.8 04/06/2016 1027      Component Value Date/Time   CALCIUM 9.2 04/05/2017 1525     CALCIUM 9.2 04/06/2016 1027   ALKPHOS 62 04/05/2017 1525   ALKPHOS 70 04/06/2016 1027   AST 25 04/05/2017 1525   AST 22 04/06/2016 1027   ALT 15 04/05/2017 1525   ALT 18 04/06/2016 1027   BILITOT 0.60 04/05/2017 1525   BILITOT 0.62 04/06/2016 1027      Impression and Plan: Kristen Lynch is a very pleasant 66 yo caucasian female with history of stage IIA infiltrating ductal carcinoma of the left breast, ER positive. She completed treatment over 16 years ago.   She had a lumpectomy followed by 4 cycles of Taxotere/Adriamycin/Cytoxan and radiation. She then went on Arimidex.   Everything looks great.  I do not see any problems with respect to recurrent disease.  I suspect that her risk of recurrence is good to be less than 5%.  She still likes to be followed by [place].  We will plan to get her back in another year.  Volanda Napoleon, MD 7/10/20192:14 PM

## 2018-04-05 LAB — VITAMIN D 25 HYDROXY (VIT D DEFICIENCY, FRACTURES): Vit D, 25-Hydroxy: 31.4 ng/mL (ref 30.0–100.0)

## 2018-05-15 DIAGNOSIS — D485 Neoplasm of uncertain behavior of skin: Secondary | ICD-10-CM | POA: Diagnosis not present

## 2018-05-15 DIAGNOSIS — D239 Other benign neoplasm of skin, unspecified: Secondary | ICD-10-CM | POA: Diagnosis not present

## 2018-05-15 DIAGNOSIS — C4401 Basal cell carcinoma of skin of lip: Secondary | ICD-10-CM | POA: Diagnosis not present

## 2018-07-19 DIAGNOSIS — M5432 Sciatica, left side: Secondary | ICD-10-CM | POA: Diagnosis not present

## 2018-07-19 DIAGNOSIS — Z23 Encounter for immunization: Secondary | ICD-10-CM | POA: Diagnosis not present

## 2018-07-24 DIAGNOSIS — C4401 Basal cell carcinoma of skin of lip: Secondary | ICD-10-CM | POA: Diagnosis not present

## 2018-08-02 ENCOUNTER — Other Ambulatory Visit (HOSPITAL_COMMUNITY): Payer: Self-pay | Admitting: Internal Medicine

## 2018-08-02 DIAGNOSIS — E041 Nontoxic single thyroid nodule: Secondary | ICD-10-CM

## 2018-08-03 ENCOUNTER — Other Ambulatory Visit: Payer: Self-pay | Admitting: Internal Medicine

## 2018-08-03 DIAGNOSIS — Z1231 Encounter for screening mammogram for malignant neoplasm of breast: Secondary | ICD-10-CM

## 2018-08-10 ENCOUNTER — Encounter (HOSPITAL_COMMUNITY): Payer: Self-pay

## 2018-08-10 ENCOUNTER — Ambulatory Visit (HOSPITAL_COMMUNITY)
Admission: RE | Admit: 2018-08-10 | Discharge: 2018-08-10 | Disposition: A | Discharge status: Home / Self Care | Payer: Medicare HMO | Source: Ambulatory Visit | Attending: Internal Medicine | Admitting: Internal Medicine

## 2018-09-13 ENCOUNTER — Ambulatory Visit
Admission: RE | Admit: 2018-09-13 | Discharge: 2018-09-13 | Disposition: A | Discharge status: Home / Self Care | Payer: Medicare HMO | Source: Ambulatory Visit | Attending: Internal Medicine | Admitting: Internal Medicine

## 2018-09-13 DIAGNOSIS — Z1231 Encounter for screening mammogram for malignant neoplasm of breast: Secondary | ICD-10-CM

## 2018-09-27 DIAGNOSIS — Z01419 Encounter for gynecological examination (general) (routine) without abnormal findings: Secondary | ICD-10-CM | POA: Diagnosis not present

## 2018-10-03 ENCOUNTER — Other Ambulatory Visit (HOSPITAL_COMMUNITY): Payer: Self-pay | Admitting: Internal Medicine

## 2018-10-03 DIAGNOSIS — E049 Nontoxic goiter, unspecified: Secondary | ICD-10-CM

## 2018-10-03 DIAGNOSIS — E041 Nontoxic single thyroid nodule: Secondary | ICD-10-CM

## 2018-10-04 ENCOUNTER — Other Ambulatory Visit: Payer: Medicare HMO

## 2018-10-09 ENCOUNTER — Other Ambulatory Visit: Payer: Medicare HMO

## 2018-10-11 DIAGNOSIS — Z0100 Encounter for examination of eyes and vision without abnormal findings: Secondary | ICD-10-CM | POA: Diagnosis not present

## 2018-10-11 DIAGNOSIS — H52229 Regular astigmatism, unspecified eye: Secondary | ICD-10-CM | POA: Diagnosis not present

## 2018-10-12 ENCOUNTER — Ambulatory Visit
Admission: RE | Admit: 2018-10-12 | Discharge: 2018-10-12 | Disposition: A | Discharge status: Home / Self Care | Payer: Medicare HMO | Source: Ambulatory Visit | Attending: Internal Medicine | Admitting: Internal Medicine

## 2018-10-12 DIAGNOSIS — E049 Nontoxic goiter, unspecified: Secondary | ICD-10-CM

## 2018-10-12 DIAGNOSIS — E041 Nontoxic single thyroid nodule: Secondary | ICD-10-CM

## 2018-10-12 DIAGNOSIS — E042 Nontoxic multinodular goiter: Secondary | ICD-10-CM | POA: Diagnosis not present

## 2019-02-22 DIAGNOSIS — D225 Melanocytic nevi of trunk: Secondary | ICD-10-CM | POA: Diagnosis not present

## 2019-02-22 DIAGNOSIS — Z85828 Personal history of other malignant neoplasm of skin: Secondary | ICD-10-CM | POA: Diagnosis not present

## 2019-02-22 DIAGNOSIS — D485 Neoplasm of uncertain behavior of skin: Secondary | ICD-10-CM | POA: Diagnosis not present

## 2019-02-22 DIAGNOSIS — L570 Actinic keratosis: Secondary | ICD-10-CM | POA: Diagnosis not present

## 2019-02-22 DIAGNOSIS — L814 Other melanin hyperpigmentation: Secondary | ICD-10-CM | POA: Diagnosis not present

## 2019-02-22 DIAGNOSIS — D0472 Carcinoma in situ of skin of left lower limb, including hip: Secondary | ICD-10-CM | POA: Diagnosis not present

## 2019-02-22 DIAGNOSIS — C44612 Basal cell carcinoma of skin of right upper limb, including shoulder: Secondary | ICD-10-CM | POA: Diagnosis not present

## 2019-02-22 DIAGNOSIS — Z86018 Personal history of other benign neoplasm: Secondary | ICD-10-CM | POA: Diagnosis not present

## 2019-02-22 DIAGNOSIS — L821 Other seborrheic keratosis: Secondary | ICD-10-CM | POA: Diagnosis not present

## 2019-03-25 ENCOUNTER — Telehealth: Payer: Self-pay | Admitting: Licensed Clinical Social Worker

## 2019-03-25 ENCOUNTER — Telehealth: Payer: Self-pay | Admitting: *Deleted

## 2019-03-25 ENCOUNTER — Telehealth: Payer: Self-pay | Admitting: Hematology & Oncology

## 2019-03-25 DIAGNOSIS — C50911 Malignant neoplasm of unspecified site of right female breast: Secondary | ICD-10-CM

## 2019-03-25 NOTE — Telephone Encounter (Signed)
Patient was diagnosed with breast cancer about 18 years ago in her 36's. She never had any genetic testing and is now interested in having this done as she has two adult daughters. Placed order for genetic counseling through [facility]. Patient is aware of referral.

## 2019-03-25 NOTE — Telephone Encounter (Signed)
error 

## 2019-03-25 NOTE — Telephone Encounter (Signed)
I called patient to schedule Genetics Appt.  She has requested to have GENETIC LAB testing done here on her next visit w/ Dr Marin Olp.  I have sent IB message to Reche Dixon to advise if we can do this here.

## 2019-03-26 ENCOUNTER — Telehealth: Payer: Self-pay | Admitting: Genetic Counselor

## 2019-03-26 NOTE — Telephone Encounter (Signed)
Left message for pt to verify webex visit for pre reg

## 2019-03-26 NOTE — Telephone Encounter (Signed)
Patient's daughter returned phone call regarding voicemail that was left, this will be a walk-in visit and lab appointment has been added.

## 2019-03-27 ENCOUNTER — Inpatient Hospital Stay: Payer: Medicare HMO | Attending: Genetic Counselor | Admitting: Genetic Counselor

## 2019-03-27 ENCOUNTER — Encounter: Payer: Self-pay | Admitting: Genetic Counselor

## 2019-03-27 ENCOUNTER — Inpatient Hospital Stay: Payer: Medicare HMO

## 2019-03-27 ENCOUNTER — Other Ambulatory Visit: Payer: Self-pay

## 2019-03-27 ENCOUNTER — Other Ambulatory Visit: Payer: Self-pay | Admitting: Genetic Counselor

## 2019-03-27 DIAGNOSIS — Z853 Personal history of malignant neoplasm of breast: Secondary | ICD-10-CM | POA: Diagnosis not present

## 2019-03-27 DIAGNOSIS — C50911 Malignant neoplasm of unspecified site of right female breast: Secondary | ICD-10-CM

## 2019-03-27 DIAGNOSIS — Z85828 Personal history of other malignant neoplasm of skin: Secondary | ICD-10-CM | POA: Insufficient documentation

## 2019-03-27 DIAGNOSIS — Z808 Family history of malignant neoplasm of other organs or systems: Secondary | ICD-10-CM

## 2019-03-27 DIAGNOSIS — Z9221 Personal history of antineoplastic chemotherapy: Secondary | ICD-10-CM | POA: Insufficient documentation

## 2019-03-27 DIAGNOSIS — Z803 Family history of malignant neoplasm of breast: Secondary | ICD-10-CM | POA: Diagnosis not present

## 2019-03-27 DIAGNOSIS — Z800 Family history of malignant neoplasm of digestive organs: Secondary | ICD-10-CM | POA: Diagnosis not present

## 2019-03-27 HISTORY — DX: Personal history of malignant neoplasm of breast: Z85.3

## 2019-03-27 NOTE — Progress Notes (Signed)
REFERRING PROVIDER: Volanda Napoleon, MD [address] [place],  [place] [address]  PRIMARY PROVIDER:  Anda Kraft, MD  PRIMARY REASON FOR VISIT:  1. Family history of breast cancer   2. Family history of colon cancer   3. Family history of brain cancer   4. History of breast cancer      HISTORY OF PRESENT ILLNESS:   Kristen Lynch, a 68 y.o. female, was seen for a [place] cancer genetics consultation at the request of Dr. Marin Olp due to a personal and family history of cancer.  Kristen Lynch presents to clinic today to discuss the possibility of a hereditary predisposition to cancer, genetic testing, and to further clarify her future cancer risks, as well as potential cancer risks for family members.   In 2002, at the age of 22, Kristen Lynch was diagnosed with invasive ductal carcinoma of the breast. The treatment plan included lumpectomy, chemotherapy and radiation.   Genetic testing was not performed at that time.     CANCER HISTORY:  Oncology History   No history exists.     RISK FACTORS:  Menarche was at age 50.  First live birth at age 42.  OCP use for approximately >5 years years.  Ovaries intact: yes.  Hysterectomy: no.  Menopausal status: postmenopausal.  HRT use: 0 years. Colonoscopy: yes; normal. Mammogram within the last year: yes. Number of breast biopsies: 1. Up to date with pelvic exams: yes. Any excessive radiation exposure in the past: no  Past Medical History:  Diagnosis Date  . Breast cancer ([place])   . Breast cancer, stage 2 ([place]) 04/22/2014  . Family history of brain cancer   . Family history of breast cancer   . Family history of colon cancer   . History of breast cancer 03/27/2019  . Hyperlipidemia   . Personal history of chemotherapy   . Personal history of radiation therapy   . Thyroid disease     Past Surgical History:  Procedure Laterality Date  . BREAST LUMPECTOMY Left    2003  . BREAST LUMPECTOMY WITH AXILLARY LYMPH NODE  BIOPSY     left breast  . COLONOSCOPY    . TONSILLECTOMY    . WISDOM TOOTH EXTRACTION      Social History   Socioeconomic History  . Marital status: Married    Spouse name: Not on file  . Number of children: 3  . Years of education: Not on file  . Highest education level: Not on file  Occupational History  . Not on file  Social Needs  . Financial resource strain: Not on file  . Food insecurity    Worry: Not on file    Inability: Not on file  . Transportation needs    Medical: Not on file    Non-medical: Not on file  Tobacco Use  . Smoking status: Former Smoker    Packs/day: 1.00    Types: Cigarettes    Quit date: 09/26/1977    Years since quitting: 41.5  . Smokeless tobacco: Never Used  . Tobacco comment: Quit smoking 36 years ago  Substance and Sexual Activity  . Alcohol use: No    Alcohol/week: 0.0 standard drinks  . Drug use: No  . Sexual activity: Not on file  Lifestyle  . Physical activity    Days per week: Not on file    Minutes per session: Not on file  . Stress: Not on file  Relationships  . Social connections  Talks on phone: Not on file    Gets together: Not on file    Attends religious service: Not on file    Active member of club or organization: Not on file    Attends meetings of clubs or organizations: Not on file    Relationship status: Not on file  Other Topics Concern  . Not on file  Social History Narrative  . Not on file     FAMILY HISTORY:  We obtained a detailed, 4-generation family history.  Significant diagnoses are listed below: Family History  Problem Relation Age of Onset  . Cancer Mother        cervical  . Stroke Mother   . Heart disease Father   . Prostate cancer Father   . Cancer Maternal Aunt        colon  . Breast cancer Cousin        dx <50  . Kidney cancer Maternal Uncle   . Heart disease Maternal Grandmother   . Breast cancer Cousin        dx over 70    The patient has two daughters and one son who are  cancer free.  She has one sister who is cancer free.  Her mother is living and her father is deceased.  The patient's father was diagnosed with prostate cancer.  He had five brothers and one sister, none who had cancer.  The paternal grandparents are deceased, but is unknown the cause of death.  The patient's mother was diagnosed with cervical cancer at 23.  She has three brothers and two sisters.  One sister had colon cancer.  She had a daughter with breast cancer. The other sister has a son who had brain cancer.  One brother had kidney cancer and another brother had a daughter with breast cancer.  The maternal grandparents are deceased from non-cancer related issues.  Kristen Lynch is unaware of previous family history of genetic testing for hereditary cancer risks. Patient's maternal ancestors are of Caucasian descent, and paternal ancestors are of Caucasian descent. There is no reported Ashkenazi Jewish ancestry. There is no known consanguinity.  GENETIC COUNSELING ASSESSMENT: Kristen Lynch is a 68 y.o. female with a personal and family history of cancer which is somewhat suggestive of a hereditary cancer syndrome and predisposition to cancer. We, therefore, discussed and recommended the following at today's visit.   DISCUSSION: We discussed that 5 - 10% of breast cancer is hereditary, with most cases associated with BRCA mutations.  There are other genes that can be associated with hereditary breast cancer syndromes.  These include ATM, CHEK2, and PALB2.  We discussed that testing is beneficial for several reasons including knowing how to follow individuals after completing their treatment, identifying whether potential treatment options such as PARP inhibitors would be beneficial, and understand if other family members could be at risk for cancer and allow them to undergo genetic testing.   We reviewed the characteristics, features and inheritance patterns of hereditary cancer syndromes. We also discussed  genetic testing, including the appropriate family members to test, the process of testing, insurance coverage and turn-around-time for results. We discussed the implications of a negative, positive and/or variant of uncertain significant result. We recommended Kristen Lynch pursue genetic testing for the common hereditary gene panel. The Common Hereditary Gene Panel offered by [facility] includes sequencing and/or deletion duplication testing of the following 48 genes: APC, ATM, AXIN2, BARD1, BMPR1A, BRCA1, BRCA2, BRIP1, CDH1, CDK4, CDKN2A (p14ARF), CDKN2A (p16INK4a), CHEK2, CTNNA1, DICER1,  EPCAM (Deletion/duplication testing only), GREM1 (promoter region deletion/duplication testing only), KIT, MEN1, MLH1, MSH2, MSH3, MSH6, MUTYH, NBN, NF1, NHTL1, PALB2, PDGFRA, PMS2, POLD1, POLE, PTEN, RAD50, RAD51C, RAD51D, RNF43, SDHB, SDHC, SDHD, SMAD4, SMARCA4. STK11, TP53, TSC1, TSC2, and VHL.  The following genes were evaluated for sequence changes only: SDHA and HOXB13 c.251G>A variant only.   Based on Kristen Lynch's personal and family history of cancer, she meets medical criteria for genetic testing. Despite that she meets criteria, she may still have an out of pocket cost. We discussed that if her out of pocket cost for testing is over $100, the laboratory will call and confirm whether she wants to proceed with testing.  If the out of pocket cost of testing is less than $100 she will be billed by the genetic testing laboratory.   PLAN: After considering the risks, benefits, and limitations, Kristen Lynch provided informed consent to pursue genetic testing and the blood sample was sent to [facility] for analysis of the common hereditary cancer panel. Results should be available within approximately 2-3 weeks' time, at which point they will be disclosed by telephone to Kristen Lynch, as will any additional recommendations warranted by these results. Kristen Lynch will receive a summary of her genetic counseling visit and a copy of  her results once available. This information will also be available in Epic.   Lastly, we encouraged Kristen Lynch to remain in contact with cancer genetics annually so that we can continuously update the family history and inform her of any changes in cancer genetics and testing that may be of benefit for this family.   Kristen Lynch questions were answered to her satisfaction today. Our contact information was provided should additional questions or concerns arise. Thank you for the referral and allowing [place] to share in the care of your patient.   Isela Stantz P. Florene Glen, [place], [facility] Certified Genetic Counselor [email or web address] phone: [phone]  The patient was seen for a total of 45 minutes in face-to-face genetic counseling.  This patient was discussed with Drs. Magrinat, Lindi Adie and/or Burr Medico who agrees with the above.    _______________________________________________________________________ For Office Staff:  Number of people involved in session: 1 Was an Intern/ student involved with case: no

## 2019-04-05 ENCOUNTER — Ambulatory Visit: Payer: Medicare HMO | Admitting: Hematology & Oncology

## 2019-04-05 ENCOUNTER — Other Ambulatory Visit: Payer: Medicare HMO

## 2019-04-12 ENCOUNTER — Telehealth: Payer: Self-pay | Admitting: Genetic Counselor

## 2019-04-12 ENCOUNTER — Encounter: Payer: Self-pay | Admitting: Genetic Counselor

## 2019-04-12 DIAGNOSIS — Z1379 Encounter for other screening for genetic and chromosomal anomalies: Secondary | ICD-10-CM | POA: Insufficient documentation

## 2019-04-12 NOTE — Telephone Encounter (Signed)
Revealed negative genetic testing.  Discussed that we do not know why she has breast cancer or why there is cancer in the family. It could be due to a different gene that we are not testing, or maybe our current technology may not be able to pick something up.  It will be important for her to keep in contact with genetics to keep up with whether additional testing may be needed. 

## 2019-04-14 IMAGING — MG DIGITAL SCREENING BILATERAL MAMMOGRAM WITH TOMO AND CAD
8 series · 9 of 24 positions shown · non-contrast
Comparison: Previous exam(s).

CLINICAL DATA: Screening.

EXAM:
DIGITAL SCREENING BILATERAL MAMMOGRAM WITH TOMO AND CAD

[L CC synth-2D]
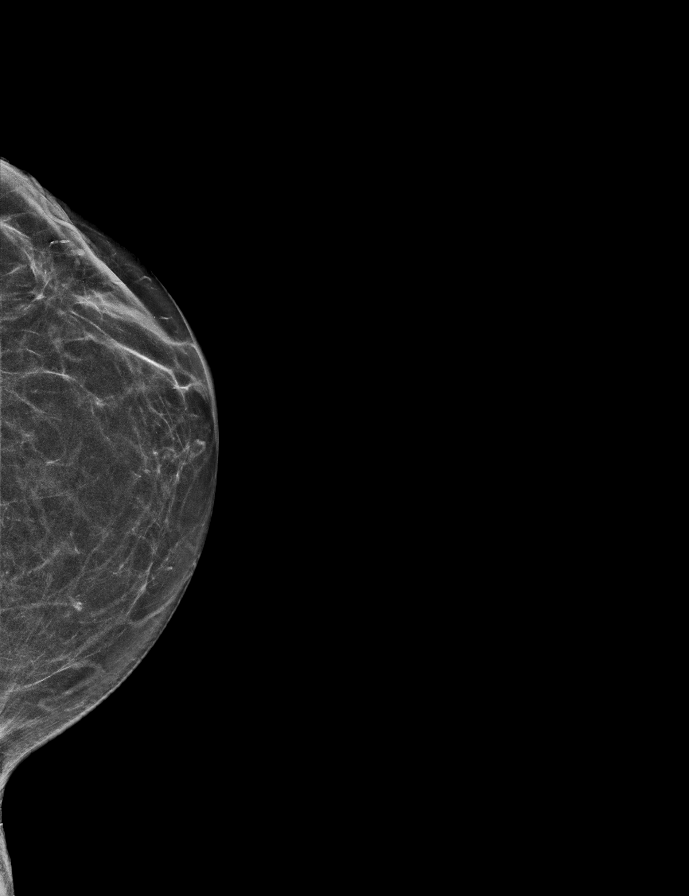

[L MLO synth-2D]
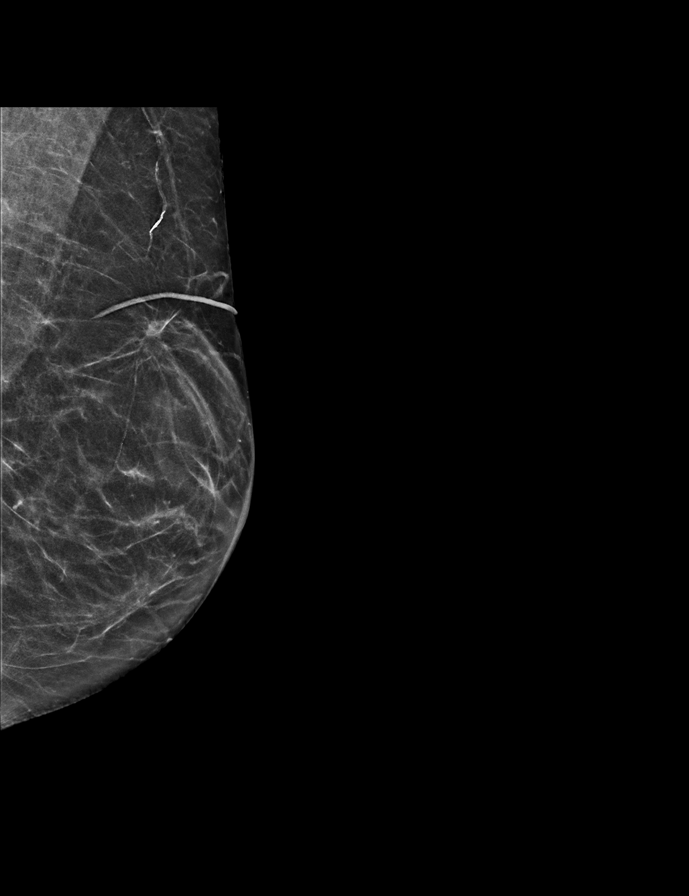

[R CC synth-2D]
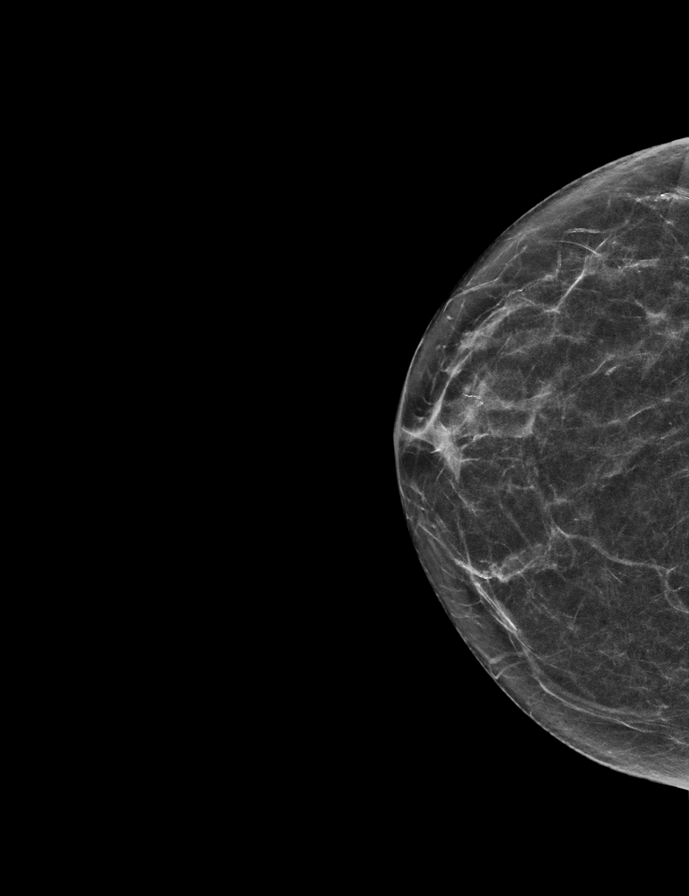

[R MLO synth-2D]
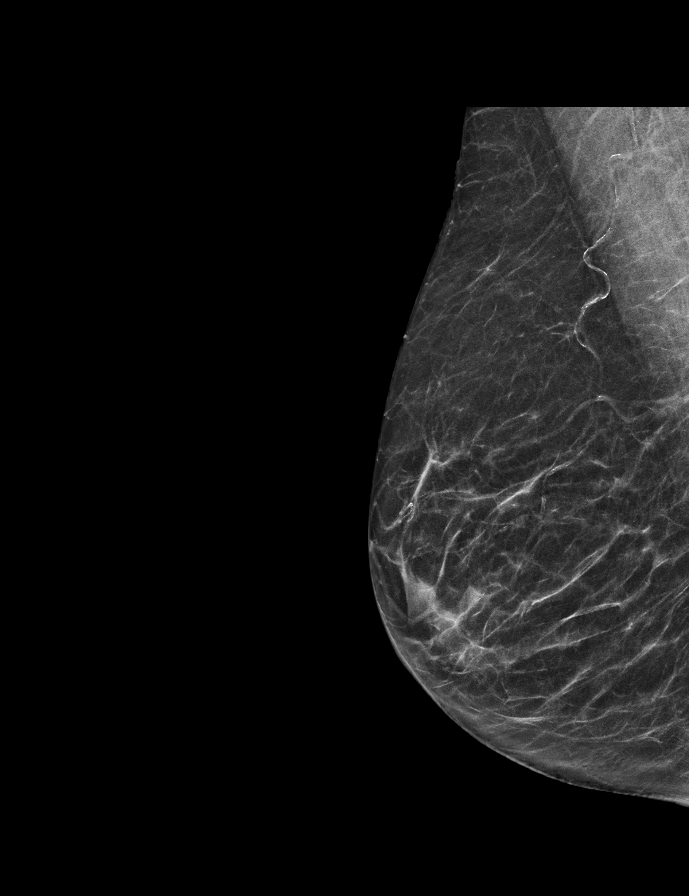

[L CC tomo · 2 of 56 frames shown]
[frame 19/56]
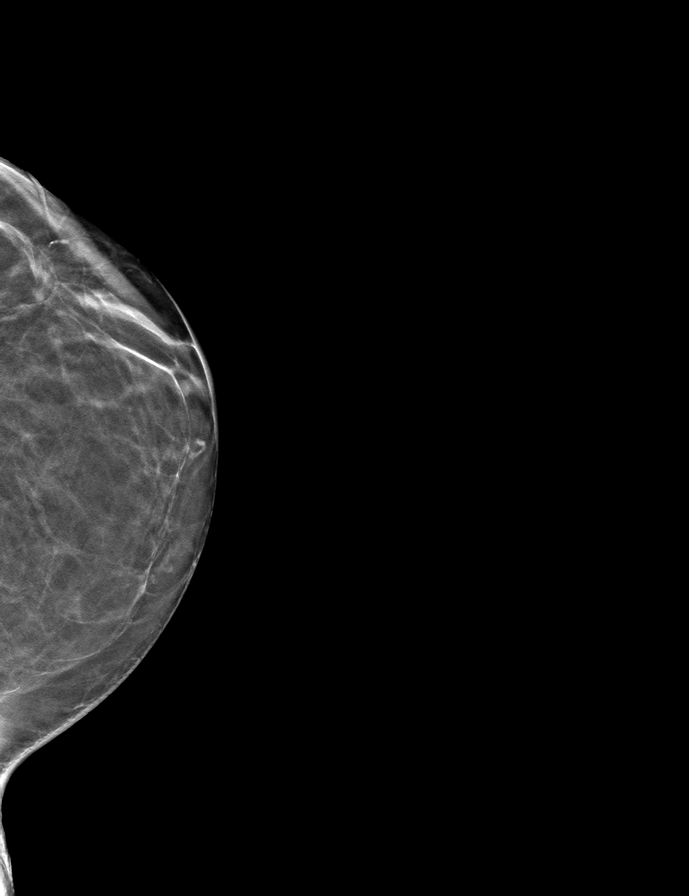
[frame 29/56]
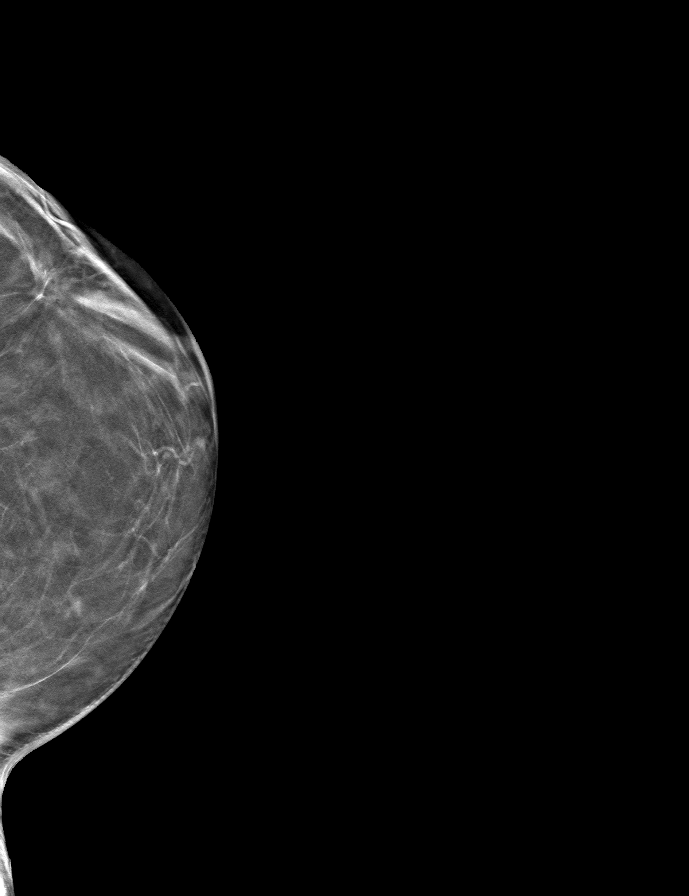

[R MLO tomo · tomo slice 27/53.0]
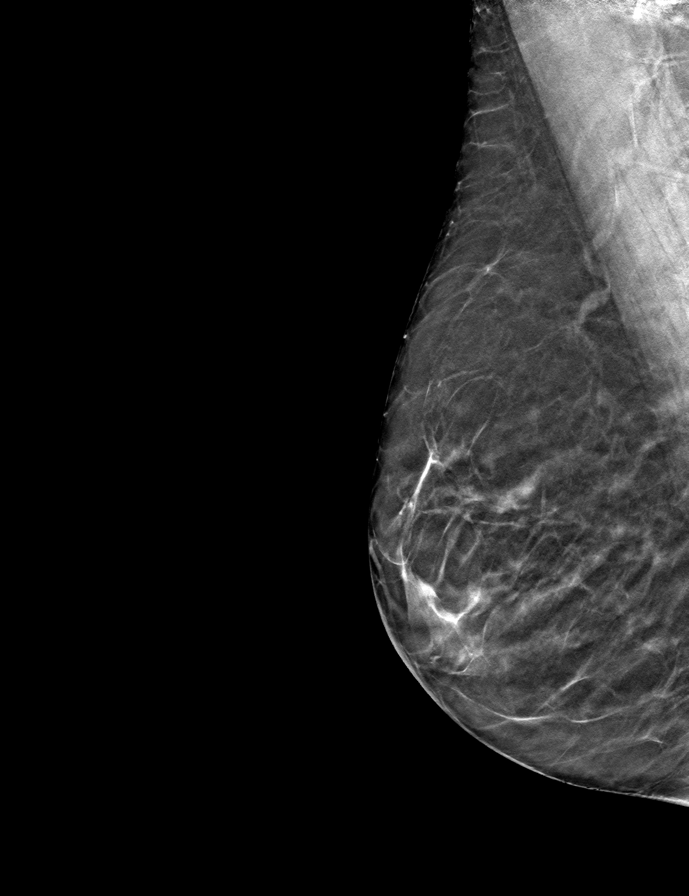

[L MLO tomo · tomo slice 27/53.0]
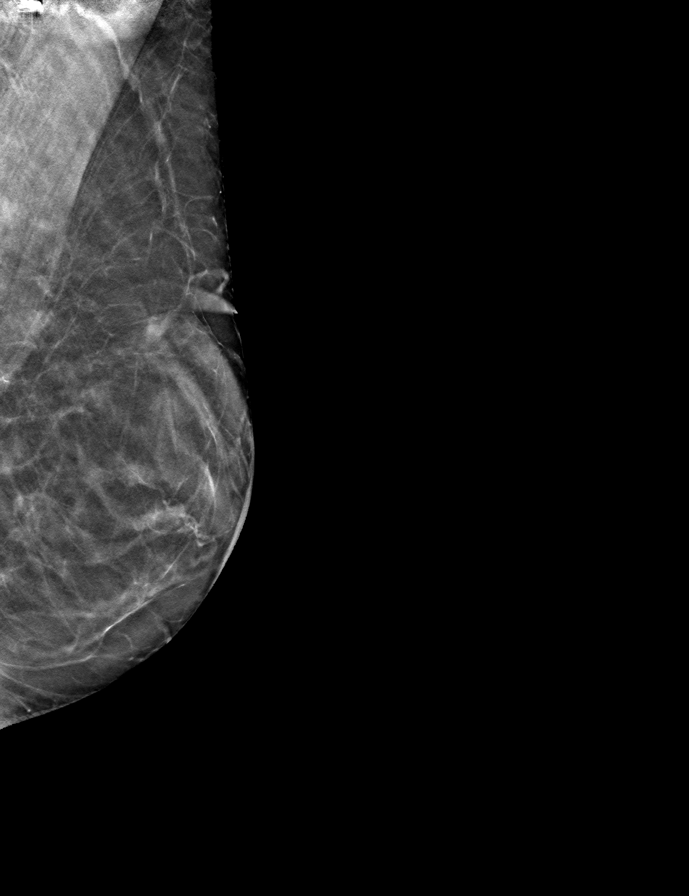

[R CC tomo · tomo slice 27/53.0]
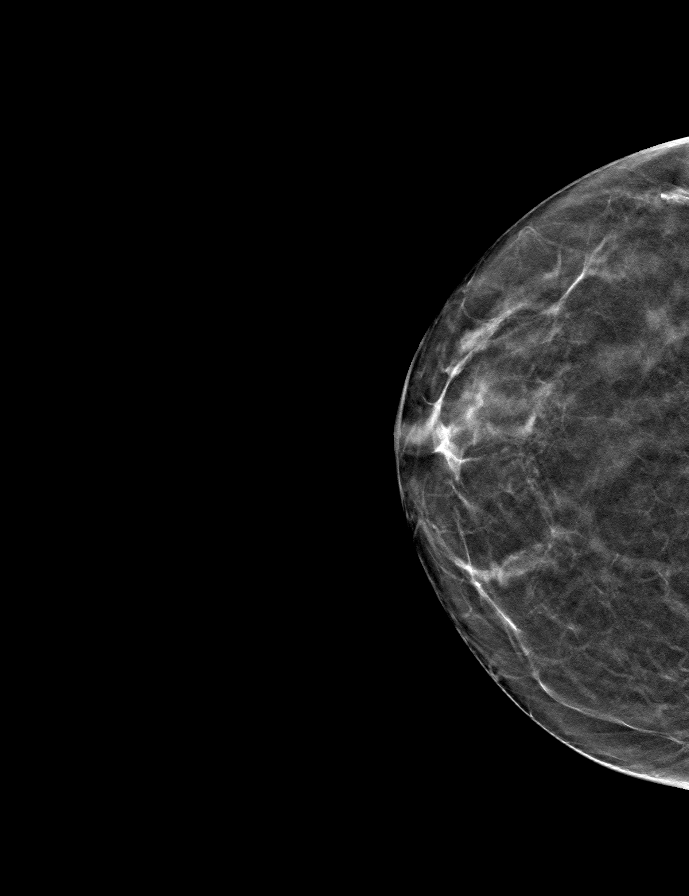

[9 of 24 positions shown; findings below may reference images not displayed]

ACR Breast Density Category b: There are scattered areas of
fibroglandular density.
FINDINGS: There are no findings suspicious for malignancy. Stable post
lumpectomy changes on the left. Images were processed with CAD.
IMPRESSION: No mammographic evidence of malignancy. A result letter of this
screening mammogram will be mailed directly to the patient.

RECOMMENDATION:
Screening mammogram in one year. (Code:5S-D-1IV)

BI-RADS CATEGORY  2: Benign.

## 2019-04-15 ENCOUNTER — Ambulatory Visit: Payer: Self-pay | Admitting: Genetic Counselor

## 2019-04-15 ENCOUNTER — Encounter: Payer: Self-pay | Admitting: Genetic Counselor

## 2019-04-15 DIAGNOSIS — Z1379 Encounter for other screening for genetic and chromosomal anomalies: Secondary | ICD-10-CM

## 2019-04-15 NOTE — Progress Notes (Signed)
HPI:  Ms. Ozbun was previously seen in the [place] clinic due to a personal and family history of cancer and concerns regarding a hereditary predisposition to cancer. Please refer to our prior cancer genetics clinic note for more information regarding our discussion, assessment and recommendations, at the time. Ms. Campisi recent genetic test results were disclosed to her, as were recommendations warranted by these results. These results and recommendations are discussed in more detail below.  CANCER HISTORY:  Oncology History   No history exists.    FAMILY HISTORY:  We obtained a detailed, 4-generation family history.  Significant diagnoses are listed below: Family History  Problem Relation Age of Onset  . Cancer Mother        cervical  . Stroke Mother   . Heart disease Father   . Prostate cancer Father   . Cancer Maternal Aunt        colon  . Breast cancer Cousin        dx <50  . Kidney cancer Maternal Uncle   . Heart disease Maternal Grandmother   . Breast cancer Cousin        dx over 10    The patient has two daughters and one son who are cancer free.  She has one sister who is cancer free.  Her mother is living and her father is deceased.  The patient's father was diagnosed with prostate cancer.  He had five brothers and one sister, none who had cancer.  The paternal grandparents are deceased, but is unknown the cause of death.  The patient's mother was diagnosed with cervical cancer at 19.  She has three brothers and two sisters.  One sister had colon cancer.  She had a daughter with breast cancer. The other sister has a son who had brain cancer.  One brother had kidney cancer and another brother had a daughter with breast cancer.  The maternal grandparents are deceased from non-cancer related issues.  Ms. Merida is unaware of previous family history of genetic testing for hereditary cancer risks. Patient's maternal ancestors are of Caucasian descent, and  paternal ancestors are of Caucasian descent. There is no reported Ashkenazi Jewish ancestry. There is no known consanguinity.    GENETIC TEST RESULTS: Genetic testing reported out on April 11, 2019 through the common hereditary cancer panel found no pathogenic mutations. The Common Hereditary Gene Panel offered by [facility] includes sequencing and/or deletion duplication testing of the following 48 genes: APC, ATM, AXIN2, BARD1, BMPR1A, BRCA1, BRCA2, BRIP1, CDH1, CDK4, CDKN2A (p14ARF), CDKN2A (p16INK4a), CHEK2, CTNNA1, DICER1, EPCAM (Deletion/duplication testing only), GREM1 (promoter region deletion/duplication testing only), KIT, MEN1, MLH1, MSH2, MSH3, MSH6, MUTYH, NBN, NF1, NHTL1, PALB2, PDGFRA, PMS2, POLD1, POLE, PTEN, RAD50, RAD51C, RAD51D, RNF43, SDHB, SDHC, SDHD, SMAD4, SMARCA4. STK11, TP53, TSC1, TSC2, and VHL.  The following genes were evaluated for sequence changes only: SDHA and HOXB13 c.251G>A variant only. The test report has been scanned into EPIC and is located under the Molecular Pathology section of the Results Review tab.  A portion of the result report is included below for reference.     We discussed with Ms. Leece that because current genetic testing is not perfect, it is possible there may be a gene mutation in one of these genes that current testing cannot detect, but that chance is small.  We also discussed, that there could be another gene that has not yet been discovered, or that we have not yet tested, that is responsible for the  cancer diagnoses in the family. It is also possible there is a hereditary cause for the cancer in the family that Ms. Luger did not inherit and therefore was not identified in her testing.  Therefore, it is important to remain in touch with cancer genetics in the future so that we can continue to offer Ms. Mastrangelo the most up to date genetic testing.   ADDITIONAL GENETIC TESTING: We discussed with Ms. Edwin that there are other genes that are associated with  increased cancer risk that can be analyzed. Should Ms. Brisky wish to pursue additional genetic testing, we are happy to discuss and coordinate this testing, at any time.    CANCER SCREENING RECOMMENDATIONS: Ms. Lofland test result is considered negative (normal).  This means that we have not identified a hereditary cause for her personal and family history of cancer at this time. Most cancers happen by chance and this negative test suggests that her cancer may fall into this category.    While reassuring, this does not definitively rule out a hereditary predisposition to cancer. It is still possible that there could be genetic mutations that are undetectable by current technology. There could be genetic mutations in genes that have not been tested or identified to increase cancer risk.  Therefore, it is recommended she continue to follow the cancer management and screening guidelines provided by her oncology and primary healthcare provider.   An individual's cancer risk and medical management are not determined by genetic test results alone. Overall cancer risk assessment incorporates additional factors, including personal medical history, family history, and any available genetic information that may result in a personalized plan for cancer prevention and surveillance  RECOMMENDATIONS FOR FAMILY MEMBERS:  Individuals in this family might be at some increased risk of developing cancer, over the general population risk, simply due to the family history of cancer.  We recommended women in this family have a yearly mammogram beginning at age 53, or 65 years younger than the earliest onset of cancer, an annual clinical breast exam, and perform monthly breast self-exams. Women in this family should also have a gynecological exam as recommended by their primary provider. All family members should have a colonoscopy by age 30.  FOLLOW-UP: Lastly, we discussed with Ms. Conran that cancer genetics is a rapidly  advancing field and it is possible that new genetic tests will be appropriate for her and/or her family members in the future. We encouraged her to remain in contact with cancer genetics on an annual basis so we can update her personal and family histories and let her know of advances in cancer genetics that may benefit this family.   Our contact number was provided. Ms. Barraclough questions were answered to her satisfaction, and she knows she is welcome to call us at anytime with additional questions or concerns.   Roma Kayser, MS, [facility] Certified Genetic Counselor [email or web address]

## 2019-04-24 ENCOUNTER — Other Ambulatory Visit: Payer: Medicare HMO

## 2019-04-24 ENCOUNTER — Other Ambulatory Visit: Payer: Self-pay | Admitting: *Deleted

## 2019-04-24 ENCOUNTER — Encounter: Payer: Medicare HMO | Admitting: Genetic Counselor

## 2019-04-24 DIAGNOSIS — E559 Vitamin D deficiency, unspecified: Secondary | ICD-10-CM

## 2019-04-24 DIAGNOSIS — C50911 Malignant neoplasm of unspecified site of right female breast: Secondary | ICD-10-CM

## 2019-04-25 ENCOUNTER — Encounter: Payer: Self-pay | Admitting: Hematology & Oncology

## 2019-04-25 ENCOUNTER — Inpatient Hospital Stay: Payer: Medicare HMO

## 2019-04-25 ENCOUNTER — Other Ambulatory Visit: Payer: Self-pay

## 2019-04-25 ENCOUNTER — Inpatient Hospital Stay (HOSPITAL_BASED_OUTPATIENT_CLINIC_OR_DEPARTMENT_OTHER): Payer: Medicare HMO | Admitting: Hematology & Oncology

## 2019-04-25 VITALS — BP 151/74 | HR 79 | Temp 97.5°F | Resp 16 | Wt 122.0 lb

## 2019-04-25 DIAGNOSIS — D0472 Carcinoma in situ of skin of left lower limb, including hip: Secondary | ICD-10-CM | POA: Diagnosis not present

## 2019-04-25 DIAGNOSIS — Z853 Personal history of malignant neoplasm of breast: Secondary | ICD-10-CM

## 2019-04-25 DIAGNOSIS — L570 Actinic keratosis: Secondary | ICD-10-CM | POA: Diagnosis not present

## 2019-04-25 DIAGNOSIS — Z9221 Personal history of antineoplastic chemotherapy: Secondary | ICD-10-CM | POA: Diagnosis not present

## 2019-04-25 DIAGNOSIS — Z85828 Personal history of other malignant neoplasm of skin: Secondary | ICD-10-CM

## 2019-04-25 DIAGNOSIS — C44729 Squamous cell carcinoma of skin of left lower limb, including hip: Secondary | ICD-10-CM | POA: Diagnosis not present

## 2019-04-25 DIAGNOSIS — C44612 Basal cell carcinoma of skin of right upper limb, including shoulder: Secondary | ICD-10-CM | POA: Diagnosis not present

## 2019-04-25 DIAGNOSIS — D485 Neoplasm of uncertain behavior of skin: Secondary | ICD-10-CM | POA: Diagnosis not present

## 2019-04-25 DIAGNOSIS — C50911 Malignant neoplasm of unspecified site of right female breast: Secondary | ICD-10-CM

## 2019-04-25 DIAGNOSIS — C50912 Malignant neoplasm of unspecified site of left female breast: Secondary | ICD-10-CM

## 2019-04-25 DIAGNOSIS — E559 Vitamin D deficiency, unspecified: Secondary | ICD-10-CM

## 2019-04-25 LAB — COMPREHENSIVE METABOLIC PANEL
ALT: 10 U/L (ref 0–44)
AST: 17 U/L (ref 15–41)
Albumin: 4.2 g/dL (ref 3.5–5.0)
Alkaline Phosphatase: 60 U/L (ref 38–126)
Anion gap: 6 (ref 5–15)
BUN: 22 mg/dL (ref 8–23)
CO2: 31 mmol/L (ref 22–32)
Calcium: 9.1 mg/dL (ref 8.9–10.3)
Chloride: 106 mmol/L (ref 98–111)
Creatinine, Ser: 0.8 mg/dL (ref 0.44–1.00)
GFR calc Af Amer: >60 mL/min (ref >60)
GFR calc non Af Amer: >60 mL/min (ref >60)
Glucose, Bld: 100 mg/dL — ABNORMAL HIGH (ref 70–99)
Potassium: 3.7 mmol/L (ref 3.5–5.1)
Sodium: 143 mmol/L (ref 135–145)
Total Bilirubin: 0.4 mg/dL (ref 0.3–1.2)
Total Protein: 6.5 g/dL (ref 6.5–8.1)

## 2019-04-25 LAB — CBC WITH DIFFERENTIAL (CANCER CENTER ONLY)
Abs Immature Granulocytes: 0.03 10*3/uL (ref 0.00–0.07)
Basophils Absolute: 0.1 10*3/uL (ref 0.0–0.1)
Basophils Relative: 1 %
Eosinophils Absolute: 0.2 10*3/uL (ref 0.0–0.5)
Eosinophils Relative: 3 %
HCT: 39.1 % (ref 36.0–46.0)
Hemoglobin: 12.6 g/dL (ref 12.0–15.0)
Immature Granulocytes: 0 %
Lymphocytes Relative: 22 %
Lymphs Abs: 1.5 10*3/uL (ref 0.7–4.0)
MCH: 30.4 pg (ref 26.0–34.0)
MCHC: 32.2 g/dL (ref 30.0–36.0)
MCV: 94.2 fL (ref 80.0–100.0)
Monocytes Absolute: 0.6 10*3/uL (ref 0.1–1.0)
Monocytes Relative: 9 %
Neutro Abs: 4.5 10*3/uL (ref 1.7–7.7)
Neutrophils Relative %: 65 %
Platelet Count: 215 10*3/uL (ref 150–400)
RBC: 4.15 MIL/uL (ref 3.87–5.11)
RDW: 11.7 % (ref 11.5–15.5)
WBC Count: 6.9 10*3/uL (ref 4.0–10.5)
nRBC: 0 % (ref 0.0–0.2)

## 2019-04-25 NOTE — Progress Notes (Signed)
Hematology and Oncology Follow Up Visit  Kristen Lynch [id number] 07/05/1951 68 y.o. 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 Kristen Lynch is here today for her annual follow-up.  She is doing quite well.  She has been very cautious with the corona virus.  She actually has to take care of 2 grandchildren so that her daughter and son-in-law can work.  She is trying to stay active.  She is trying to walk.  She has some skin cancers removed today.  None were melanoma.  She has had no fever.  She has had no change in bowel or bladder habits.  She has had no nausea or vomiting.  She has her mammograms at the end of the year.  There is been no bleeding.  She is very good with taking her vitamin D.    Overall, her performance status is ECOG 0.    Medications:  Allergies as of 04/25/2019   No Known Allergies     Medication List       Accurate as of April 25, 2019  3:59 PM. If you have any questions, ask your nurse or doctor.        aspirin 81 MG tablet Take 81 mg by mouth daily.   calcium carbonate 600 MG Tabs tablet Commonly known as: OS-CAL Take 600 mg by mouth 2 (two) times daily with a meal.   cholecalciferol 1000 units tablet Commonly known as: VITAMIN D Take 1,000 Units by mouth daily.   Crestor 10 MG tablet Generic drug: rosuvastatin Take 10 mg by mouth daily.       Allergies: No Known Allergies  Past Medical History, Surgical history, Social history, and Family History were reviewed and updated.  Review of Systems: Review of Systems  Constitutional: Negative.   HENT: Negative.   Eyes: Negative.   Respiratory: Negative.   Cardiovascular: Negative.   Gastrointestinal: Negative.   Genitourinary: Negative.   Musculoskeletal: Negative.   Skin: Negative.   Neurological: Negative.   Endo/Heme/Allergies: Negative.    Psychiatric/Behavioral: Negative.      Physical Exam:  weight is 122 lb (55.3 kg). Her oral temperature is 97.5 F (36.4 C) (abnormal). Her blood pressure is 151/74 (abnormal) and her pulse is 79. Her respiration is 16 and oxygen saturation is 100%.   Wt Readings from Last 3 Encounters:  04/25/19 122 lb (55.3 kg)  04/04/18 127 lb 12.8 oz (58 kg)  04/05/17 124 lb (56.2 kg)    Physical Exam Vitals signs reviewed.  HENT:     Head: Normocephalic and atraumatic.  Eyes:     Pupils: Pupils are equal, round, and reactive to light.  Neck:     Musculoskeletal: Normal range of motion.  Cardiovascular:     Rate and Rhythm: Normal rate and regular rhythm.     Heart sounds: Normal heart sounds.  Pulmonary:     Effort: Pulmonary effort is normal.     Breath sounds: Normal breath sounds.  Abdominal:     General: Bowel sounds are normal.     Palpations: Abdomen is soft.  Musculoskeletal: Normal range of motion.        General: No tenderness or deformity.  Lymphadenopathy:     Cervical: No cervical adenopathy.  Skin:    General: Skin is warm and dry.     Findings: No erythema or rash.  Neurological:  Mental Status: She is alert and oriented to person, place, and time.  Psychiatric:        Behavior: Behavior normal.        Thought Content: Thought content normal.        Judgment: Judgment normal.      Lab Results  Component Value Date   WBC 6.9 04/25/2019   HGB 12.6 04/25/2019   HCT 39.1 04/25/2019   MCV 94.2 04/25/2019   PLT 215 04/25/2019   No results found for: FERRITIN, IRON, TIBC, UIBC, IRONPCTSAT Lab Results  Component Value Date   RBC 4.15 04/25/2019   No results found for: KPAFRELGTCHN, LAMBDASER, KAPLAMBRATIO No results found for: IGGSERUM, IGA, IGMSERUM No results found for: Odetta Pink, SPEI   Chemistry      Component Value Date/Time   NA 143 04/25/2019 1446   NA 149 (H) 04/05/2017 1525   NA 144  04/06/2016 1027   K 3.7 04/25/2019 1446   K 4.2 04/05/2017 1525   K 4.2 04/06/2016 1027   CL 106 04/25/2019 1446   CL 111 (H) 04/05/2017 1525   CO2 31 04/25/2019 1446   CO2 33 04/05/2017 1525   CO2 29 04/06/2016 1027   BUN 22 04/25/2019 1446   BUN 21 04/05/2017 1525   BUN 23.0 04/06/2016 1027   CREATININE 0.80 04/25/2019 1446   CREATININE 0.7 04/05/2017 1525   CREATININE 0.8 04/06/2016 1027      Component Value Date/Time   CALCIUM 9.1 04/25/2019 1446   CALCIUM 9.2 04/05/2017 1525   CALCIUM 9.2 04/06/2016 1027   ALKPHOS 60 04/25/2019 1446   ALKPHOS 62 04/05/2017 1525   ALKPHOS 70 04/06/2016 1027   AST 17 04/25/2019 1446   AST 25 04/05/2017 1525   AST 22 04/06/2016 1027   ALT 10 04/25/2019 1446   ALT 15 04/05/2017 1525   ALT 18 04/06/2016 1027   BILITOT 0.4 04/25/2019 1446   BILITOT 0.60 04/05/2017 1525   BILITOT 0.62 04/06/2016 1027      Impression and Plan: Ms. Kristen Lynch is a very pleasant 68 yo caucasian female with history of stage IIA infiltrating ductal carcinoma of the left breast, ER positive. She completed treatment 17 years ago.   She had a lumpectomy followed by 4 cycles of Taxotere/Adriamycin/Cytoxan and radiation. She then went on Arimidex.   Everything looks great.  I do not see any problems with respect to recurrent disease.  I suspect that her risk of recurrence is good to be less than 1%.  She still likes to be followed by [place].  We will plan to get her back in another year.  Volanda Napoleon, MD 7/30/20203:59 PM

## 2019-04-26 LAB — VITAMIN D 25 HYDROXY (VIT D DEFICIENCY, FRACTURES): Vit D, 25-Hydroxy: 49 ng/mL (ref 30.0–100.0)

## 2019-04-29 ENCOUNTER — Encounter: Payer: Medicare HMO | Admitting: Licensed Clinical Social Worker

## 2019-05-23 DIAGNOSIS — C44729 Squamous cell carcinoma of skin of left lower limb, including hip: Secondary | ICD-10-CM | POA: Diagnosis not present

## 2019-05-23 DIAGNOSIS — C44311 Basal cell carcinoma of skin of nose: Secondary | ICD-10-CM | POA: Diagnosis not present

## 2019-05-23 DIAGNOSIS — L905 Scar conditions and fibrosis of skin: Secondary | ICD-10-CM | POA: Diagnosis not present

## 2019-05-23 DIAGNOSIS — C44629 Squamous cell carcinoma of skin of left upper limb, including shoulder: Secondary | ICD-10-CM | POA: Diagnosis not present

## 2019-05-23 DIAGNOSIS — D485 Neoplasm of uncertain behavior of skin: Secondary | ICD-10-CM | POA: Diagnosis not present

## 2019-07-11 DIAGNOSIS — Z23 Encounter for immunization: Secondary | ICD-10-CM | POA: Diagnosis not present

## 2019-07-11 DIAGNOSIS — Z7189 Other specified counseling: Secondary | ICD-10-CM | POA: Diagnosis not present

## 2019-07-11 DIAGNOSIS — Z79899 Other long term (current) drug therapy: Secondary | ICD-10-CM | POA: Diagnosis not present

## 2019-07-11 DIAGNOSIS — E789 Disorder of lipoprotein metabolism, unspecified: Secondary | ICD-10-CM | POA: Diagnosis not present

## 2019-07-11 DIAGNOSIS — E782 Mixed hyperlipidemia: Secondary | ICD-10-CM | POA: Diagnosis not present

## 2019-07-11 DIAGNOSIS — E041 Nontoxic single thyroid nodule: Secondary | ICD-10-CM | POA: Diagnosis not present

## 2019-07-11 DIAGNOSIS — E049 Nontoxic goiter, unspecified: Secondary | ICD-10-CM | POA: Diagnosis not present

## 2019-07-11 DIAGNOSIS — Z0000 Encounter for general adult medical examination without abnormal findings: Secondary | ICD-10-CM | POA: Diagnosis not present

## 2019-07-15 DIAGNOSIS — R69 Illness, unspecified: Secondary | ICD-10-CM | POA: Diagnosis not present

## 2019-07-18 DIAGNOSIS — Z7189 Other specified counseling: Secondary | ICD-10-CM | POA: Diagnosis not present

## 2019-07-18 DIAGNOSIS — E782 Mixed hyperlipidemia: Secondary | ICD-10-CM | POA: Diagnosis not present

## 2019-07-18 DIAGNOSIS — E049 Nontoxic goiter, unspecified: Secondary | ICD-10-CM | POA: Diagnosis not present

## 2019-07-18 DIAGNOSIS — E041 Nontoxic single thyroid nodule: Secondary | ICD-10-CM | POA: Diagnosis not present

## 2019-07-18 DIAGNOSIS — C44311 Basal cell carcinoma of skin of nose: Secondary | ICD-10-CM | POA: Diagnosis not present

## 2019-07-18 DIAGNOSIS — Z0000 Encounter for general adult medical examination without abnormal findings: Secondary | ICD-10-CM | POA: Diagnosis not present

## 2019-08-05 ENCOUNTER — Other Ambulatory Visit: Payer: Self-pay | Admitting: Hematology & Oncology

## 2019-08-05 DIAGNOSIS — Z1231 Encounter for screening mammogram for malignant neoplasm of breast: Secondary | ICD-10-CM

## 2019-09-24 ENCOUNTER — Ambulatory Visit
Admission: RE | Admit: 2019-09-24 | Discharge: 2019-09-24 | Disposition: A | Discharge status: Home / Self Care | Payer: Medicare HMO | Source: Ambulatory Visit | Attending: Hematology & Oncology | Admitting: Hematology & Oncology

## 2019-09-24 ENCOUNTER — Other Ambulatory Visit: Payer: Self-pay

## 2019-09-24 DIAGNOSIS — Z1231 Encounter for screening mammogram for malignant neoplasm of breast: Secondary | ICD-10-CM

## 2019-09-30 DIAGNOSIS — Z01419 Encounter for gynecological examination (general) (routine) without abnormal findings: Secondary | ICD-10-CM | POA: Diagnosis not present

## 2019-09-30 DIAGNOSIS — Z0000 Encounter for general adult medical examination without abnormal findings: Secondary | ICD-10-CM | POA: Diagnosis not present

## 2019-11-19 DIAGNOSIS — Z135 Encounter for screening for eye and ear disorders: Secondary | ICD-10-CM | POA: Diagnosis not present

## 2019-11-19 DIAGNOSIS — Z0100 Encounter for examination of eyes and vision without abnormal findings: Secondary | ICD-10-CM | POA: Diagnosis not present

## 2019-11-19 DIAGNOSIS — H259 Unspecified age-related cataract: Secondary | ICD-10-CM | POA: Diagnosis not present

## 2019-11-19 DIAGNOSIS — H5210 Myopia, unspecified eye: Secondary | ICD-10-CM | POA: Diagnosis not present

## 2019-11-19 DIAGNOSIS — E7800 Pure hypercholesterolemia, unspecified: Secondary | ICD-10-CM | POA: Diagnosis not present

## 2020-01-21 IMAGING — US US THYROID
1 series · 14 of 25 positions shown · non-contrast
Comparison: 07/24/2017

CLINICAL DATA: Prior ultrasound follow-up.  Follow-up nodules.

EXAM:
THYROID ULTRASOUND
TECHNIQUE: Ultrasound examination of the thyroid gland and adjacent soft
tissues was performed.

[Series 1: us thyroid · 0.05mm/px · 14 of 47 slices shown]
[im 1/47]
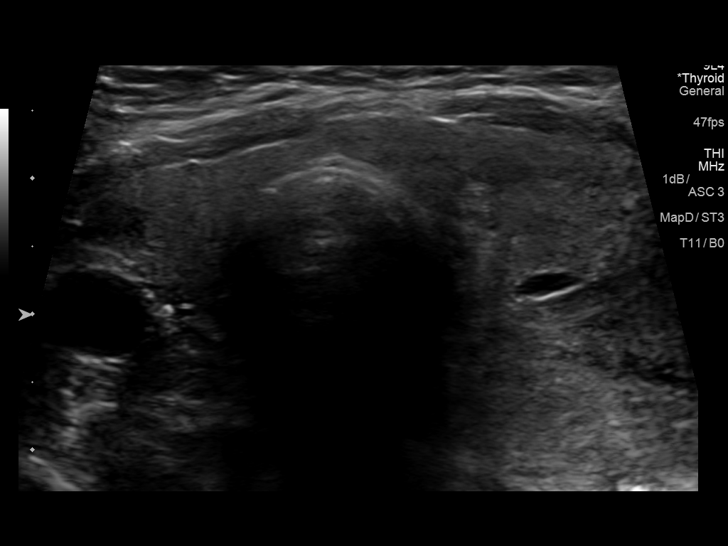
[im 4/47]
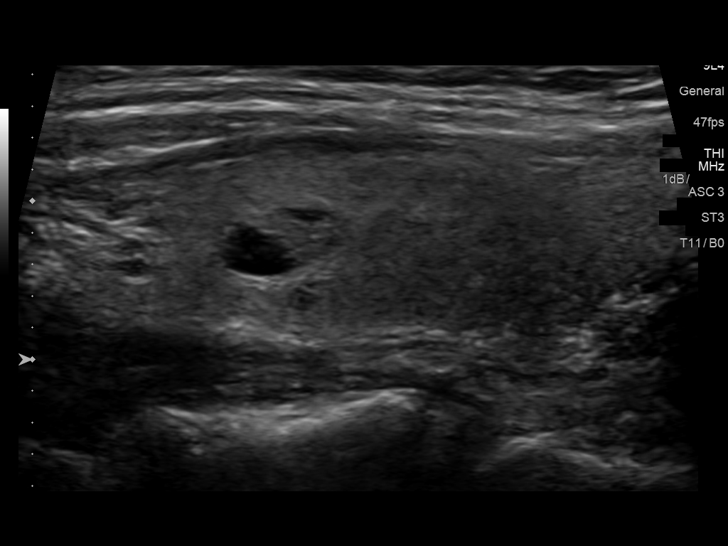
[im 8/47]
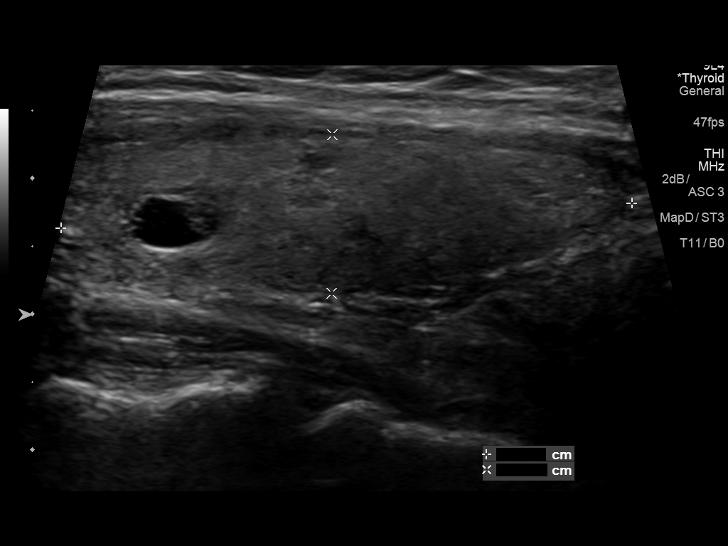
[im 12/47]
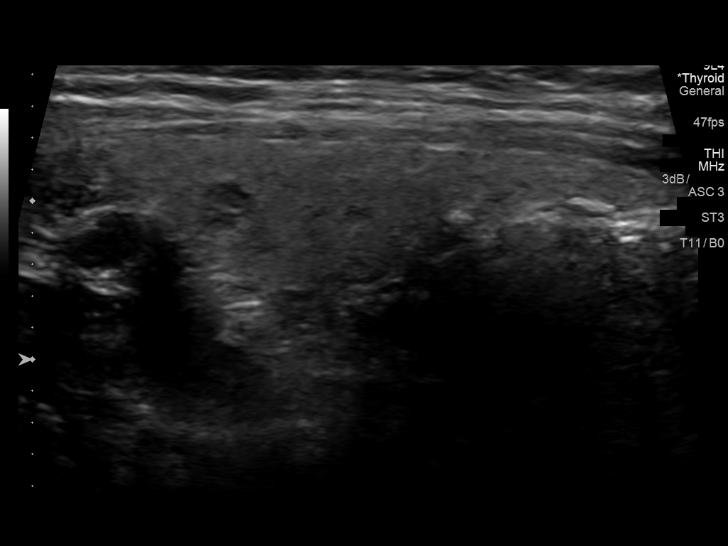
[im 16/47]
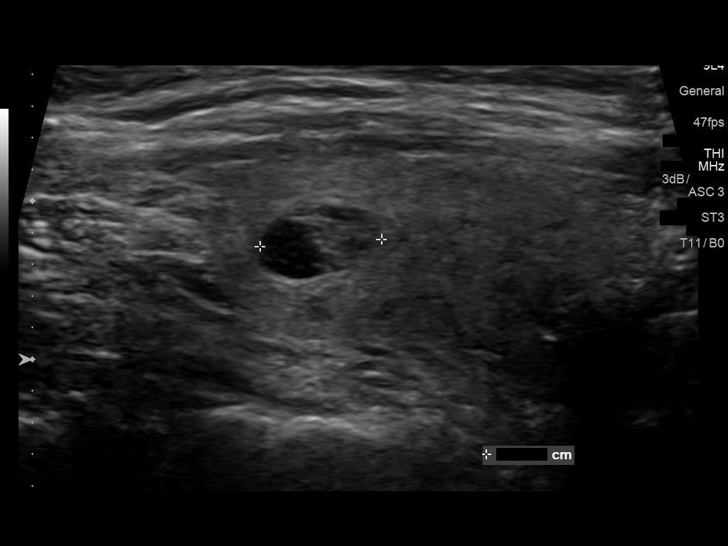
[im 18/47]
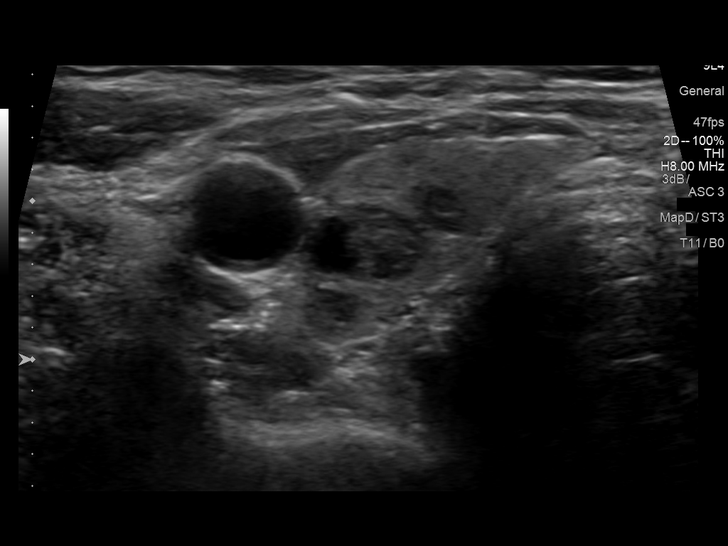
[im 22/47]
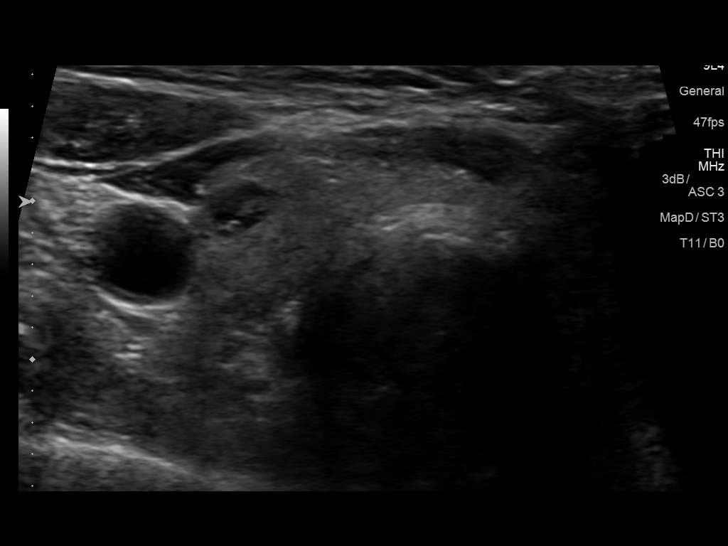
[im 25/47]
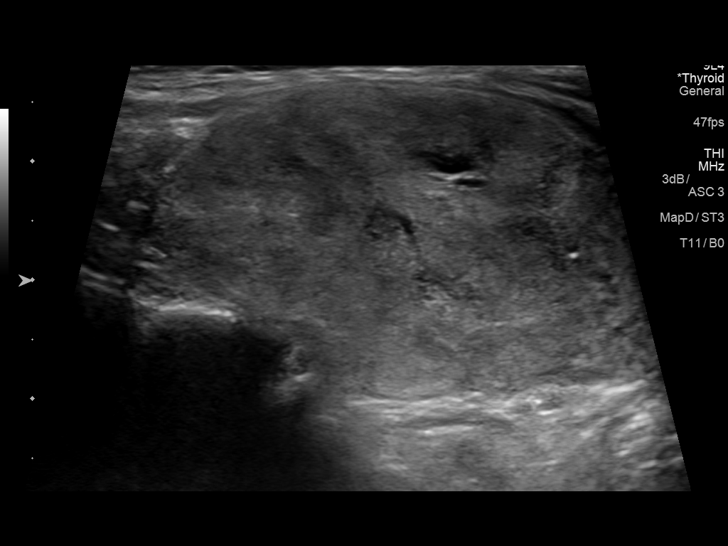
[im 29/47]
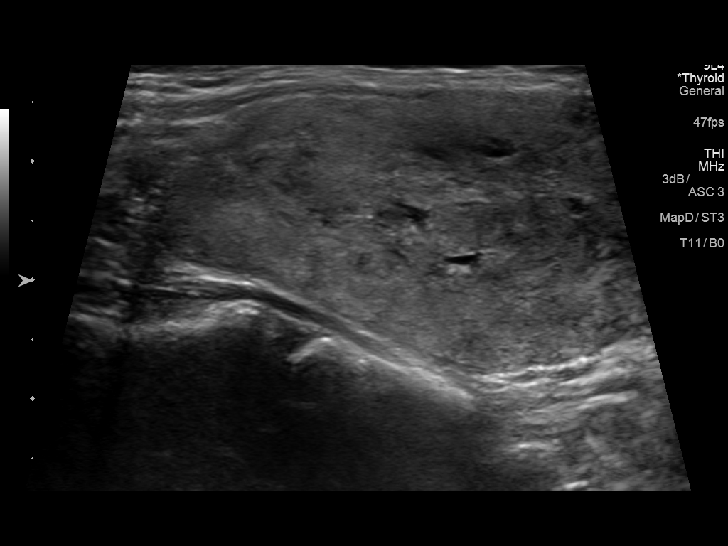
[im 31/47]
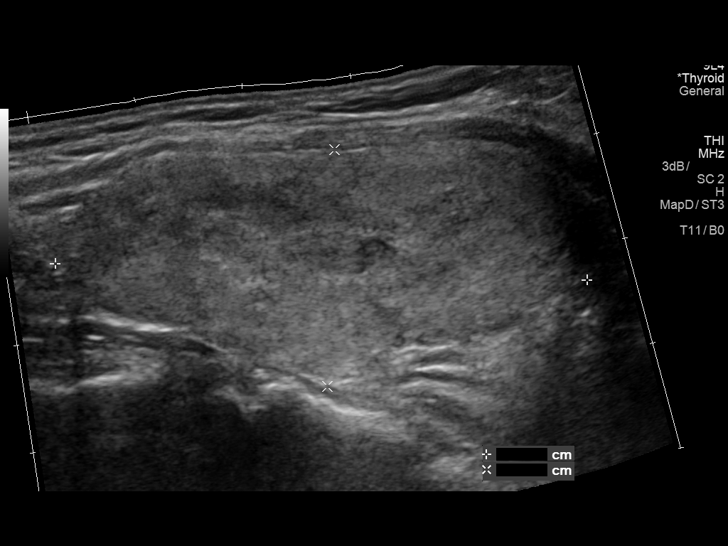
[im 35/47]
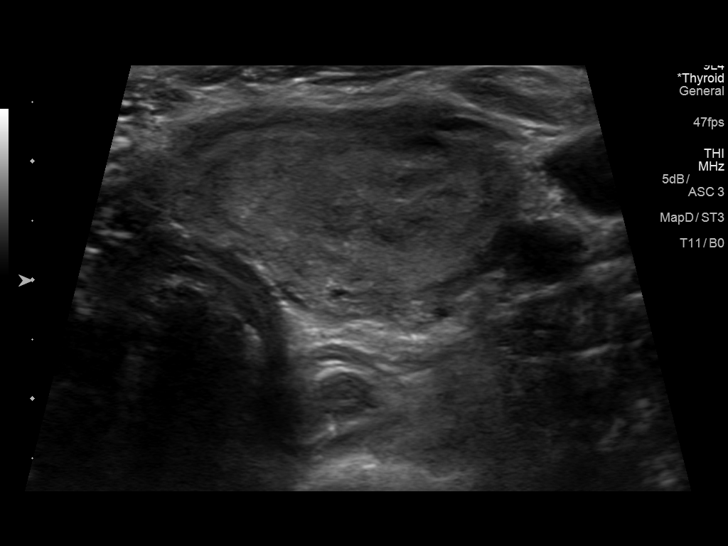
[im 39/47]
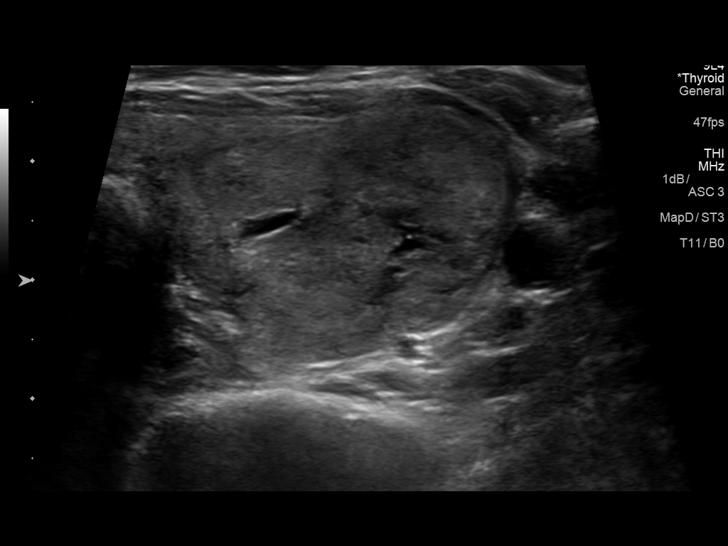
[im 43/47]
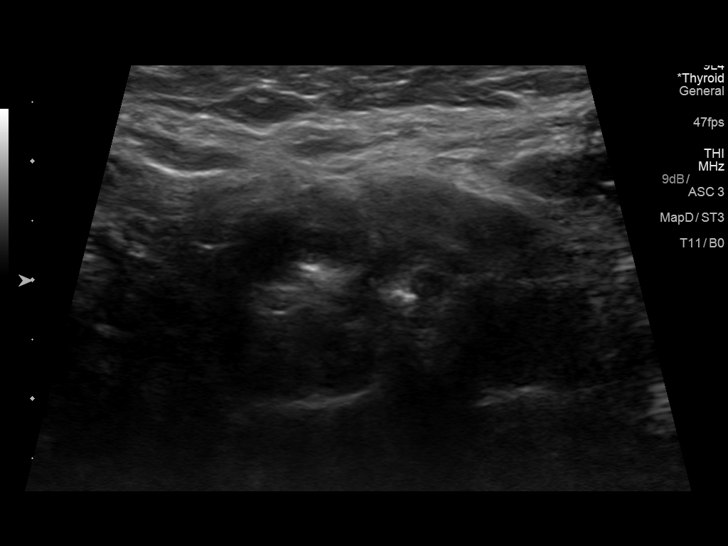
[im 47/47]
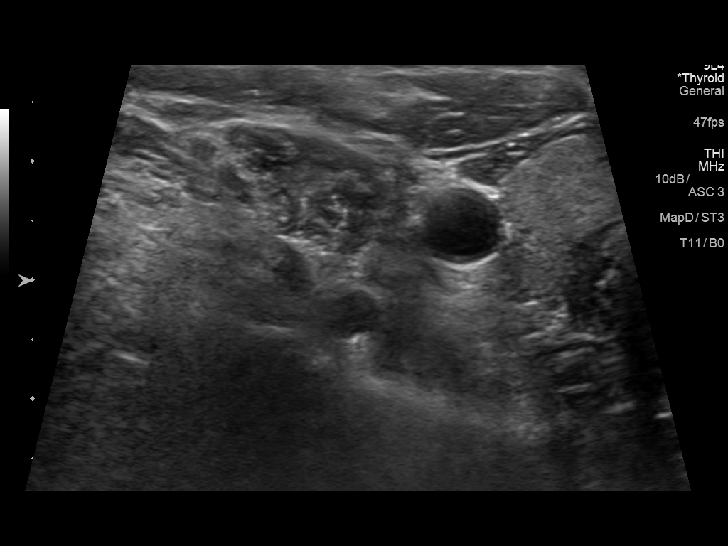

[14 of 25 positions shown; findings below may reference images not displayed]

FINDINGS: Parenchymal Echotexture: Mildly heterogenous

Isthmus: 0.3 cm, previously 0.2 cm

Right lobe: 4.2 x 1.2 x 1.3 cm, previously 3.8 x 1.1 x 1.3 cm

Left lobe: 4.9 x 2.2 x 3.0 cm, previously 5.8 x 1.7 x 3.2 cm

_________________________________________________________

Estimated total number of nodules >/= 1 cm: 1

Number of spongiform nodules >/=  2 cm not described below (TR1): 0

Number of mixed cystic and solid nodules >/= 1.5 cm not described
below (TR2): 0

_________________________________________________________

Dominant left lobe nodule measures 4.9 x 3.1 x 2.0 cm and previously
measured 4.6 x 2.8 x 2.1 cm. Biopsy was performed 10/28/2008.

Right upper pole 0.8 cm nodule previously measured 0.7 cm and does
not meet criteria for biopsy nor follow-up.
IMPRESSION: Dominant left lobe nodule 2 is stable and previously underwent
biopsy.

The above is in keeping with the ACR TI-RADS recommendations - [HOSPITAL] 7976;[DATE].

## 2020-02-18 DIAGNOSIS — D225 Melanocytic nevi of trunk: Secondary | ICD-10-CM | POA: Diagnosis not present

## 2020-02-18 DIAGNOSIS — L814 Other melanin hyperpigmentation: Secondary | ICD-10-CM | POA: Diagnosis not present

## 2020-02-18 DIAGNOSIS — C44519 Basal cell carcinoma of skin of other part of trunk: Secondary | ICD-10-CM | POA: Diagnosis not present

## 2020-02-18 DIAGNOSIS — L570 Actinic keratosis: Secondary | ICD-10-CM | POA: Diagnosis not present

## 2020-02-18 DIAGNOSIS — L821 Other seborrheic keratosis: Secondary | ICD-10-CM | POA: Diagnosis not present

## 2020-02-18 DIAGNOSIS — L578 Other skin changes due to chronic exposure to nonionizing radiation: Secondary | ICD-10-CM | POA: Diagnosis not present

## 2020-02-18 DIAGNOSIS — Z86018 Personal history of other benign neoplasm: Secondary | ICD-10-CM | POA: Diagnosis not present

## 2020-02-18 DIAGNOSIS — D485 Neoplasm of uncertain behavior of skin: Secondary | ICD-10-CM | POA: Diagnosis not present

## 2020-02-18 DIAGNOSIS — C44311 Basal cell carcinoma of skin of nose: Secondary | ICD-10-CM | POA: Diagnosis not present

## 2020-02-18 DIAGNOSIS — Z85828 Personal history of other malignant neoplasm of skin: Secondary | ICD-10-CM | POA: Diagnosis not present

## 2020-03-26 DIAGNOSIS — C44519 Basal cell carcinoma of skin of other part of trunk: Secondary | ICD-10-CM | POA: Diagnosis not present

## 2020-03-26 DIAGNOSIS — D485 Neoplasm of uncertain behavior of skin: Secondary | ICD-10-CM | POA: Diagnosis not present

## 2020-03-26 DIAGNOSIS — C44729 Squamous cell carcinoma of skin of left lower limb, including hip: Secondary | ICD-10-CM | POA: Diagnosis not present

## 2020-04-23 ENCOUNTER — Ambulatory Visit: Payer: Medicare HMO | Admitting: Hematology & Oncology

## 2020-04-23 ENCOUNTER — Other Ambulatory Visit: Payer: Medicare HMO

## 2020-04-27 DIAGNOSIS — L905 Scar conditions and fibrosis of skin: Secondary | ICD-10-CM | POA: Diagnosis not present

## 2020-04-27 DIAGNOSIS — C44729 Squamous cell carcinoma of skin of left lower limb, including hip: Secondary | ICD-10-CM | POA: Diagnosis not present

## 2020-04-27 DIAGNOSIS — C44311 Basal cell carcinoma of skin of nose: Secondary | ICD-10-CM | POA: Diagnosis not present

## 2020-05-15 ENCOUNTER — Ambulatory Visit: Payer: Medicare HMO | Admitting: Hematology & Oncology

## 2020-05-15 ENCOUNTER — Other Ambulatory Visit: Payer: Medicare HMO

## 2020-05-18 ENCOUNTER — Other Ambulatory Visit: Payer: Self-pay | Admitting: *Deleted

## 2020-05-18 DIAGNOSIS — C50912 Malignant neoplasm of unspecified site of left female breast: Secondary | ICD-10-CM

## 2020-05-19 ENCOUNTER — Inpatient Hospital Stay: Payer: Medicare HMO | Attending: Hematology & Oncology

## 2020-05-19 ENCOUNTER — Other Ambulatory Visit: Payer: Self-pay

## 2020-05-19 ENCOUNTER — Inpatient Hospital Stay (HOSPITAL_BASED_OUTPATIENT_CLINIC_OR_DEPARTMENT_OTHER): Payer: Medicare HMO | Admitting: Hematology & Oncology

## 2020-05-19 VITALS — BP 161/78 | HR 67 | Temp 98.2°F | Resp 18 | Wt 127.0 lb

## 2020-05-19 DIAGNOSIS — Z85828 Personal history of other malignant neoplasm of skin: Secondary | ICD-10-CM | POA: Diagnosis not present

## 2020-05-19 DIAGNOSIS — C50912 Malignant neoplasm of unspecified site of left female breast: Secondary | ICD-10-CM

## 2020-05-19 DIAGNOSIS — Z853 Personal history of malignant neoplasm of breast: Secondary | ICD-10-CM | POA: Diagnosis not present

## 2020-05-19 LAB — CBC WITH DIFFERENTIAL (CANCER CENTER ONLY)
Abs Immature Granulocytes: 0.03 10*3/uL (ref 0.00–0.07)
Basophils Absolute: 0 10*3/uL (ref 0.0–0.1)
Basophils Relative: 1 %
Eosinophils Absolute: 0.6 10*3/uL — ABNORMAL HIGH (ref 0.0–0.5)
Eosinophils Relative: 9 %
HCT: 40.1 % (ref 36.0–46.0)
Hemoglobin: 13.1 g/dL (ref 12.0–15.0)
Immature Granulocytes: 0 %
Lymphocytes Relative: 29 %
Lymphs Abs: 2 10*3/uL (ref 0.7–4.0)
MCH: 30.4 pg (ref 26.0–34.0)
MCHC: 32.7 g/dL (ref 30.0–36.0)
MCV: 93 fL (ref 80.0–100.0)
Monocytes Absolute: 0.6 10*3/uL (ref 0.1–1.0)
Monocytes Relative: 9 %
Neutro Abs: 3.6 10*3/uL (ref 1.7–7.7)
Neutrophils Relative %: 52 %
Platelet Count: 202 10*3/uL (ref 150–400)
RBC: 4.31 MIL/uL (ref 3.87–5.11)
RDW: 11.9 % (ref 11.5–15.5)
WBC Count: 6.9 10*3/uL (ref 4.0–10.5)
nRBC: 0 % (ref 0.0–0.2)

## 2020-05-19 LAB — COMPREHENSIVE METABOLIC PANEL
ALT: 9 U/L (ref 0–44)
AST: 15 U/L (ref 15–41)
Albumin: 4.4 g/dL (ref 3.5–5.0)
Alkaline Phosphatase: 64 U/L (ref 38–126)
Anion gap: 7 (ref 5–15)
BUN: 21 mg/dL (ref 8–23)
CO2: 31 mmol/L (ref 22–32)
Calcium: 9.8 mg/dL (ref 8.9–10.3)
Chloride: 107 mmol/L (ref 98–111)
Creatinine, Ser: 0.85 mg/dL (ref 0.44–1.00)
GFR calc Af Amer: >60 mL/min (ref >60)
GFR calc non Af Amer: >60 mL/min (ref >60)
Glucose, Bld: 90 mg/dL (ref 70–99)
Potassium: 4 mmol/L (ref 3.5–5.1)
Sodium: 145 mmol/L (ref 135–145)
Total Bilirubin: 0.6 mg/dL (ref 0.3–1.2)
Total Protein: 6.9 g/dL (ref 6.5–8.1)

## 2020-05-19 NOTE — Progress Notes (Signed)
Hematology and Oncology Follow Up Visit  Kristen Lynch [id number] 1951-01-18 69 y.o. 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  She is doing quite well.  She has had some problems with skin cancer.  She did have a lesion taken off the inner aspect of her left lower leg.  She also had a little bit of a basal cell removed from the left side of her nose.  She has 6 grandchildren.  They all went on vacation to the beach in June.  She has had no problems with fatigue or weakness.  Unfortunately, a grandson was found to have diabetes.  He is only 67 years old.  There has been no change in bowel or bladder habits.  She gets her yearly mammograms.  She is trying to stay active.  She is walking.  She does take care of a couple grandchildren every day.  Overall, her performance status is ECOG 0.  Medications:  Allergies as of 05/19/2020   No Known Allergies     Medication List       Accurate as of May 19, 2020  8:27 AM. If you have any questions, ask your nurse or doctor.        aspirin 81 MG tablet Take 81 mg by mouth daily.   calcium carbonate 600 MG Tabs tablet Commonly known as: OS-CAL Take 600 mg by mouth 2 (two) times daily with a meal.   cholecalciferol 1000 units tablet Commonly known as: VITAMIN D Take 1,000 Units by mouth daily.   Crestor 10 MG tablet Generic drug: rosuvastatin Take 10 mg by mouth daily.       Allergies: No Known Allergies  Past Medical History, Surgical history, Social history, and Family History were reviewed and updated.  Review of Systems: Review of Systems  Constitutional: Negative.   HENT: Negative.   Eyes: Negative.   Respiratory: Negative.   Cardiovascular: Negative.   Gastrointestinal: Negative.   Genitourinary: Negative.   Musculoskeletal:  Negative.   Skin: Negative.   Neurological: Negative.   Endo/Heme/Allergies: Negative.   Psychiatric/Behavioral: Negative.      Physical Exam:  weight is 127 lb (57.6 kg). Her oral temperature is 98.2 F (36.8 C). Her blood pressure is 161/78 (abnormal) and her pulse is 67. Her respiration is 18 and oxygen saturation is 100%.   Wt Readings from Last 3 Encounters:  05/19/20 127 lb (57.6 kg)  04/25/19 122 lb (55.3 kg)  04/04/18 127 lb 12.8 oz (58 kg)    Physical Exam Vitals reviewed.  HENT:     Head: Normocephalic and atraumatic.  Eyes:     Pupils: Pupils are equal, round, and reactive to light.  Cardiovascular:     Rate and Rhythm: Normal rate and regular rhythm.     Heart sounds: Normal heart sounds.  Pulmonary:     Effort: Pulmonary effort is normal.     Breath sounds: Normal breath sounds.  Abdominal:     General: Bowel sounds are normal.     Palpations: Abdomen is soft.  Musculoskeletal:        General: No tenderness or deformity. Normal range of motion.     Cervical back: Normal range of motion.  Lymphadenopathy:     Cervical: No cervical adenopathy.  Skin:    General: Skin is warm and dry.  Findings: No erythema or rash.  Neurological:     Mental Status: She is alert and oriented to person, place, and time.  Psychiatric:        Behavior: Behavior normal.        Thought Content: Thought content normal.        Judgment: Judgment normal.      Lab Results  Component Value Date   WBC 6.9 05/19/2020   HGB 13.1 05/19/2020   HCT 40.1 05/19/2020   MCV 93.0 05/19/2020   PLT 202 05/19/2020   No results found for: FERRITIN, IRON, TIBC, UIBC, IRONPCTSAT Lab Results  Component Value Date   RBC 4.31 05/19/2020   No results found for: KPAFRELGTCHN, LAMBDASER, KAPLAMBRATIO No results found for: IGGSERUM, IGA, IGMSERUM No results found for: Odetta Pink, SPEI   Chemistry      Component Value Date/Time    NA 143 04/25/2019 1446   NA 149 (H) 04/05/2017 1525   NA 144 04/06/2016 1027   K 3.7 04/25/2019 1446   K 4.2 04/05/2017 1525   K 4.2 04/06/2016 1027   CL 106 04/25/2019 1446   CL 111 (H) 04/05/2017 1525   CO2 31 04/25/2019 1446   CO2 33 04/05/2017 1525   CO2 29 04/06/2016 1027   BUN 22 04/25/2019 1446   BUN 21 04/05/2017 1525   BUN 23.0 04/06/2016 1027   CREATININE 0.80 04/25/2019 1446   CREATININE 0.7 04/05/2017 1525   CREATININE 0.8 04/06/2016 1027      Component Value Date/Time   CALCIUM 9.1 04/25/2019 1446   CALCIUM 9.2 04/05/2017 1525   CALCIUM 9.2 04/06/2016 1027   ALKPHOS 60 04/25/2019 1446   ALKPHOS 62 04/05/2017 1525   ALKPHOS 70 04/06/2016 1027   AST 17 04/25/2019 1446   AST 25 04/05/2017 1525   AST 22 04/06/2016 1027   ALT 10 04/25/2019 1446   ALT 15 04/05/2017 1525   ALT 18 04/06/2016 1027   BILITOT 0.4 04/25/2019 1446   BILITOT 0.60 04/05/2017 1525   BILITOT 0.62 04/06/2016 1027      Impression and Plan: Kristen Lynch is a very pleasant 69 yo caucasian female with history of stage IIA infiltrating ductal carcinoma of the left breast, ER positive. She completed treatment 20 years ago.   She had a lumpectomy followed by 4 cycles of Taxotere/Adriamycin/Cytoxan and radiation. She then went on Arimidex.   Everything looks great.  I do not see any problems with respect to recurrent disease.  I suspect that her risk of recurrence is good to be less than 1%.  At this point, we can letter go from the practice.  I really do not think that his cancer is going to come back.  I realize that it is estrogen positive so there is at small chance but I think the chances are incredibly low.  It was just a privilege to be able to help her.  We share our faith.  She has such a strong [place] woman.  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     Volanda Napoleon, MD 8/24/20218:27 AM

## 2020-05-20 ENCOUNTER — Inpatient Hospital Stay: Payer: Medicare HMO | Admitting: Hematology & Oncology

## 2020-05-20 ENCOUNTER — Inpatient Hospital Stay: Payer: Medicare HMO

## 2020-07-23 DIAGNOSIS — Z23 Encounter for immunization: Secondary | ICD-10-CM | POA: Diagnosis not present

## 2020-07-23 DIAGNOSIS — E782 Mixed hyperlipidemia: Secondary | ICD-10-CM | POA: Diagnosis not present

## 2020-07-23 DIAGNOSIS — E049 Nontoxic goiter, unspecified: Secondary | ICD-10-CM | POA: Diagnosis not present

## 2020-07-23 DIAGNOSIS — Z0000 Encounter for general adult medical examination without abnormal findings: Secondary | ICD-10-CM | POA: Diagnosis not present

## 2020-07-30 DIAGNOSIS — Z23 Encounter for immunization: Secondary | ICD-10-CM | POA: Diagnosis not present

## 2020-07-30 DIAGNOSIS — E049 Nontoxic goiter, unspecified: Secondary | ICD-10-CM | POA: Diagnosis not present

## 2020-07-30 DIAGNOSIS — E041 Nontoxic single thyroid nodule: Secondary | ICD-10-CM | POA: Diagnosis not present

## 2020-07-30 DIAGNOSIS — E782 Mixed hyperlipidemia: Secondary | ICD-10-CM | POA: Diagnosis not present

## 2020-07-30 DIAGNOSIS — Z0000 Encounter for general adult medical examination without abnormal findings: Secondary | ICD-10-CM | POA: Diagnosis not present

## 2020-08-04 ENCOUNTER — Other Ambulatory Visit: Payer: Self-pay | Admitting: Family Medicine

## 2020-08-04 NOTE — Progress Notes (Unsigned)
Bmp

## 2020-08-17 ENCOUNTER — Other Ambulatory Visit: Payer: Self-pay | Admitting: Internal Medicine

## 2020-08-17 DIAGNOSIS — Z1231 Encounter for screening mammogram for malignant neoplasm of breast: Secondary | ICD-10-CM

## 2020-10-01 ENCOUNTER — Ambulatory Visit
Admission: RE | Admit: 2020-10-01 | Discharge: 2020-10-01 | Disposition: A | Discharge status: Home / Self Care | Payer: Medicare HMO | Source: Ambulatory Visit | Attending: Internal Medicine | Admitting: Internal Medicine

## 2020-10-01 ENCOUNTER — Other Ambulatory Visit: Payer: Self-pay

## 2020-10-01 DIAGNOSIS — Z1231 Encounter for screening mammogram for malignant neoplasm of breast: Secondary | ICD-10-CM | POA: Diagnosis not present

## 2021-02-16 DIAGNOSIS — Z6822 Body mass index (BMI) 22.0-22.9, adult: Secondary | ICD-10-CM | POA: Diagnosis not present

## 2021-02-16 DIAGNOSIS — Z853 Personal history of malignant neoplasm of breast: Secondary | ICD-10-CM | POA: Diagnosis not present

## 2021-02-16 DIAGNOSIS — Z01411 Encounter for gynecological examination (general) (routine) with abnormal findings: Secondary | ICD-10-CM | POA: Diagnosis not present

## 2021-02-16 DIAGNOSIS — M8580 Other specified disorders of bone density and structure, unspecified site: Secondary | ICD-10-CM | POA: Diagnosis not present

## 2021-02-16 DIAGNOSIS — Z124 Encounter for screening for malignant neoplasm of cervix: Secondary | ICD-10-CM | POA: Diagnosis not present

## 2021-02-24 ENCOUNTER — Other Ambulatory Visit: Payer: Self-pay | Admitting: Obstetrics and Gynecology

## 2021-02-24 DIAGNOSIS — M8580 Other specified disorders of bone density and structure, unspecified site: Secondary | ICD-10-CM

## 2021-03-18 ENCOUNTER — Other Ambulatory Visit: Payer: Self-pay | Admitting: Internal Medicine

## 2021-03-18 DIAGNOSIS — Z1231 Encounter for screening mammogram for malignant neoplasm of breast: Secondary | ICD-10-CM

## 2021-03-31 DIAGNOSIS — E7800 Pure hypercholesterolemia, unspecified: Secondary | ICD-10-CM | POA: Diagnosis not present

## 2021-03-31 DIAGNOSIS — H5213 Myopia, bilateral: Secondary | ICD-10-CM | POA: Diagnosis not present

## 2021-06-04 DIAGNOSIS — L578 Other skin changes due to chronic exposure to nonionizing radiation: Secondary | ICD-10-CM | POA: Diagnosis not present

## 2021-06-04 DIAGNOSIS — L814 Other melanin hyperpigmentation: Secondary | ICD-10-CM | POA: Diagnosis not present

## 2021-06-04 DIAGNOSIS — D485 Neoplasm of uncertain behavior of skin: Secondary | ICD-10-CM | POA: Diagnosis not present

## 2021-06-04 DIAGNOSIS — L821 Other seborrheic keratosis: Secondary | ICD-10-CM | POA: Diagnosis not present

## 2021-06-04 DIAGNOSIS — L570 Actinic keratosis: Secondary | ICD-10-CM | POA: Diagnosis not present

## 2021-06-04 DIAGNOSIS — D225 Melanocytic nevi of trunk: Secondary | ICD-10-CM | POA: Diagnosis not present

## 2021-06-04 DIAGNOSIS — L918 Other hypertrophic disorders of the skin: Secondary | ICD-10-CM | POA: Diagnosis not present

## 2021-06-04 DIAGNOSIS — Z86018 Personal history of other benign neoplasm: Secondary | ICD-10-CM | POA: Diagnosis not present

## 2021-06-04 DIAGNOSIS — D239 Other benign neoplasm of skin, unspecified: Secondary | ICD-10-CM | POA: Diagnosis not present

## 2021-06-04 DIAGNOSIS — C44619 Basal cell carcinoma of skin of left upper limb, including shoulder: Secondary | ICD-10-CM | POA: Diagnosis not present

## 2021-06-04 DIAGNOSIS — Z85828 Personal history of other malignant neoplasm of skin: Secondary | ICD-10-CM | POA: Diagnosis not present

## 2021-07-01 DIAGNOSIS — C44619 Basal cell carcinoma of skin of left upper limb, including shoulder: Secondary | ICD-10-CM | POA: Diagnosis not present

## 2021-07-01 DIAGNOSIS — C4491 Basal cell carcinoma of skin, unspecified: Secondary | ICD-10-CM | POA: Diagnosis not present

## 2021-07-01 DIAGNOSIS — L905 Scar conditions and fibrosis of skin: Secondary | ICD-10-CM | POA: Diagnosis not present

## 2021-08-10 DIAGNOSIS — E0789 Other specified disorders of thyroid: Secondary | ICD-10-CM | POA: Diagnosis not present

## 2021-08-10 DIAGNOSIS — Z23 Encounter for immunization: Secondary | ICD-10-CM | POA: Diagnosis not present

## 2021-08-10 DIAGNOSIS — Z0000 Encounter for general adult medical examination without abnormal findings: Secondary | ICD-10-CM | POA: Diagnosis not present

## 2021-08-10 DIAGNOSIS — E782 Mixed hyperlipidemia: Secondary | ICD-10-CM | POA: Diagnosis not present

## 2021-08-17 DIAGNOSIS — E0789 Other specified disorders of thyroid: Secondary | ICD-10-CM | POA: Diagnosis not present

## 2021-08-17 DIAGNOSIS — Z0000 Encounter for general adult medical examination without abnormal findings: Secondary | ICD-10-CM | POA: Diagnosis not present

## 2021-08-17 DIAGNOSIS — E782 Mixed hyperlipidemia: Secondary | ICD-10-CM | POA: Diagnosis not present

## 2021-08-17 DIAGNOSIS — N182 Chronic kidney disease, stage 2 (mild): Secondary | ICD-10-CM | POA: Diagnosis not present

## 2021-08-17 DIAGNOSIS — E049 Nontoxic goiter, unspecified: Secondary | ICD-10-CM | POA: Diagnosis not present

## 2021-09-01 DIAGNOSIS — K1121 Acute sialoadenitis: Secondary | ICD-10-CM | POA: Diagnosis not present

## 2021-10-04 ENCOUNTER — Ambulatory Visit
Admission: RE | Admit: 2021-10-04 | Discharge: 2021-10-04 | Disposition: A | Discharge status: Home / Self Care | Payer: Medicare HMO | Source: Ambulatory Visit | Attending: Internal Medicine | Admitting: Internal Medicine

## 2021-10-04 ENCOUNTER — Ambulatory Visit
Admission: RE | Admit: 2021-10-04 | Discharge: 2021-10-04 | Disposition: A | Discharge status: Home / Self Care | Payer: Medicare HMO | Source: Ambulatory Visit | Attending: Obstetrics and Gynecology | Admitting: Obstetrics and Gynecology

## 2021-10-04 DIAGNOSIS — M8580 Other specified disorders of bone density and structure, unspecified site: Secondary | ICD-10-CM

## 2021-10-04 DIAGNOSIS — Z1231 Encounter for screening mammogram for malignant neoplasm of breast: Secondary | ICD-10-CM

## 2021-10-04 DIAGNOSIS — M8589 Other specified disorders of bone density and structure, multiple sites: Secondary | ICD-10-CM | POA: Diagnosis not present

## 2021-10-04 DIAGNOSIS — Z780 Asymptomatic menopausal state: Secondary | ICD-10-CM | POA: Diagnosis not present

## 2022-04-13 DIAGNOSIS — E7800 Pure hypercholesterolemia, unspecified: Secondary | ICD-10-CM | POA: Diagnosis not present

## 2022-04-13 DIAGNOSIS — H5210 Myopia, unspecified eye: Secondary | ICD-10-CM | POA: Diagnosis not present

## 2022-04-13 DIAGNOSIS — Z0100 Encounter for examination of eyes and vision without abnormal findings: Secondary | ICD-10-CM | POA: Diagnosis not present

## 2022-06-14 DIAGNOSIS — C44622 Squamous cell carcinoma of skin of right upper limb, including shoulder: Secondary | ICD-10-CM | POA: Diagnosis not present

## 2022-06-14 DIAGNOSIS — D239 Other benign neoplasm of skin, unspecified: Secondary | ICD-10-CM | POA: Diagnosis not present

## 2022-06-14 DIAGNOSIS — C44519 Basal cell carcinoma of skin of other part of trunk: Secondary | ICD-10-CM | POA: Diagnosis not present

## 2022-06-14 DIAGNOSIS — C44722 Squamous cell carcinoma of skin of right lower limb, including hip: Secondary | ICD-10-CM | POA: Diagnosis not present

## 2022-06-14 DIAGNOSIS — L570 Actinic keratosis: Secondary | ICD-10-CM | POA: Diagnosis not present

## 2022-06-14 DIAGNOSIS — L578 Other skin changes due to chronic exposure to nonionizing radiation: Secondary | ICD-10-CM | POA: Diagnosis not present

## 2022-06-14 DIAGNOSIS — Z86018 Personal history of other benign neoplasm: Secondary | ICD-10-CM | POA: Diagnosis not present

## 2022-06-14 DIAGNOSIS — D485 Neoplasm of uncertain behavior of skin: Secondary | ICD-10-CM | POA: Diagnosis not present

## 2022-06-14 DIAGNOSIS — D225 Melanocytic nevi of trunk: Secondary | ICD-10-CM | POA: Diagnosis not present

## 2022-06-14 DIAGNOSIS — L820 Inflamed seborrheic keratosis: Secondary | ICD-10-CM | POA: Diagnosis not present

## 2022-06-14 DIAGNOSIS — Z85828 Personal history of other malignant neoplasm of skin: Secondary | ICD-10-CM | POA: Diagnosis not present

## 2022-06-14 DIAGNOSIS — L821 Other seborrheic keratosis: Secondary | ICD-10-CM | POA: Diagnosis not present

## 2022-06-14 DIAGNOSIS — L814 Other melanin hyperpigmentation: Secondary | ICD-10-CM | POA: Diagnosis not present

## 2022-07-11 DIAGNOSIS — C44622 Squamous cell carcinoma of skin of right upper limb, including shoulder: Secondary | ICD-10-CM | POA: Diagnosis not present

## 2022-07-11 DIAGNOSIS — C44519 Basal cell carcinoma of skin of other part of trunk: Secondary | ICD-10-CM | POA: Diagnosis not present

## 2022-07-25 DIAGNOSIS — C44722 Squamous cell carcinoma of skin of right lower limb, including hip: Secondary | ICD-10-CM | POA: Diagnosis not present

## 2022-08-08 DIAGNOSIS — T8149XA Infection following a procedure, other surgical site, initial encounter: Secondary | ICD-10-CM | POA: Diagnosis not present

## 2022-08-08 DIAGNOSIS — Z5189 Encounter for other specified aftercare: Secondary | ICD-10-CM | POA: Diagnosis not present

## 2022-08-08 DIAGNOSIS — C44719 Basal cell carcinoma of skin of left lower limb, including hip: Secondary | ICD-10-CM | POA: Diagnosis not present

## 2022-08-22 DIAGNOSIS — Z23 Encounter for immunization: Secondary | ICD-10-CM | POA: Diagnosis not present

## 2022-08-22 DIAGNOSIS — R5383 Other fatigue: Secondary | ICD-10-CM | POA: Diagnosis not present

## 2022-08-22 DIAGNOSIS — Z0000 Encounter for general adult medical examination without abnormal findings: Secondary | ICD-10-CM | POA: Diagnosis not present

## 2022-08-22 DIAGNOSIS — E782 Mixed hyperlipidemia: Secondary | ICD-10-CM | POA: Diagnosis not present

## 2022-08-24 DIAGNOSIS — Z8589 Personal history of malignant neoplasm of other organs and systems: Secondary | ICD-10-CM | POA: Diagnosis not present

## 2022-08-24 DIAGNOSIS — Z5189 Encounter for other specified aftercare: Secondary | ICD-10-CM | POA: Diagnosis not present

## 2022-08-26 ENCOUNTER — Other Ambulatory Visit: Payer: Self-pay | Admitting: Internal Medicine

## 2022-08-26 DIAGNOSIS — Z1231 Encounter for screening mammogram for malignant neoplasm of breast: Secondary | ICD-10-CM

## 2022-08-30 DIAGNOSIS — N182 Chronic kidney disease, stage 2 (mild): Secondary | ICD-10-CM | POA: Diagnosis not present

## 2022-08-30 DIAGNOSIS — E782 Mixed hyperlipidemia: Secondary | ICD-10-CM | POA: Diagnosis not present

## 2022-08-30 DIAGNOSIS — E049 Nontoxic goiter, unspecified: Secondary | ICD-10-CM | POA: Diagnosis not present

## 2022-08-30 DIAGNOSIS — Z23 Encounter for immunization: Secondary | ICD-10-CM | POA: Diagnosis not present

## 2022-08-30 DIAGNOSIS — E0789 Other specified disorders of thyroid: Secondary | ICD-10-CM | POA: Diagnosis not present

## 2022-08-30 DIAGNOSIS — Z0000 Encounter for general adult medical examination without abnormal findings: Secondary | ICD-10-CM | POA: Diagnosis not present

## 2022-08-30 DIAGNOSIS — R5383 Other fatigue: Secondary | ICD-10-CM | POA: Diagnosis not present

## 2022-09-07 DIAGNOSIS — R69 Illness, unspecified: Secondary | ICD-10-CM | POA: Diagnosis not present

## 2022-09-07 DIAGNOSIS — Z205 Contact with and (suspected) exposure to viral hepatitis: Secondary | ICD-10-CM | POA: Diagnosis not present

## 2022-10-24 ENCOUNTER — Ambulatory Visit
Admission: RE | Admit: 2022-10-24 | Discharge: 2022-10-24 | Disposition: A | Discharge status: Home / Self Care | Payer: Medicare HMO | Source: Ambulatory Visit | Attending: Internal Medicine | Admitting: Internal Medicine

## 2022-10-24 DIAGNOSIS — Z1231 Encounter for screening mammogram for malignant neoplasm of breast: Secondary | ICD-10-CM

## 2022-10-31 DIAGNOSIS — Z5189 Encounter for other specified aftercare: Secondary | ICD-10-CM | POA: Diagnosis not present

## 2022-10-31 DIAGNOSIS — Z85828 Personal history of other malignant neoplasm of skin: Secondary | ICD-10-CM | POA: Diagnosis not present

## 2022-12-01 DIAGNOSIS — Z01419 Encounter for gynecological examination (general) (routine) without abnormal findings: Secondary | ICD-10-CM | POA: Diagnosis not present

## 2022-12-01 DIAGNOSIS — Z124 Encounter for screening for malignant neoplasm of cervix: Secondary | ICD-10-CM | POA: Diagnosis not present

## 2022-12-01 DIAGNOSIS — Z0000 Encounter for general adult medical examination without abnormal findings: Secondary | ICD-10-CM | POA: Diagnosis not present

## 2022-12-01 DIAGNOSIS — Z01411 Encounter for gynecological examination (general) (routine) with abnormal findings: Secondary | ICD-10-CM | POA: Diagnosis not present

## 2022-12-01 DIAGNOSIS — Z6820 Body mass index (BMI) 20.0-20.9, adult: Secondary | ICD-10-CM | POA: Diagnosis not present

## 2023-04-19 DIAGNOSIS — H5213 Myopia, bilateral: Secondary | ICD-10-CM | POA: Diagnosis not present

## 2023-04-19 DIAGNOSIS — H524 Presbyopia: Secondary | ICD-10-CM | POA: Diagnosis not present

## 2023-04-19 DIAGNOSIS — Z135 Encounter for screening for eye and ear disorders: Secondary | ICD-10-CM | POA: Diagnosis not present

## 2023-04-19 DIAGNOSIS — H52223 Regular astigmatism, bilateral: Secondary | ICD-10-CM | POA: Diagnosis not present

## 2023-07-03 DIAGNOSIS — L578 Other skin changes due to chronic exposure to nonionizing radiation: Secondary | ICD-10-CM | POA: Diagnosis not present

## 2023-07-03 DIAGNOSIS — L570 Actinic keratosis: Secondary | ICD-10-CM | POA: Diagnosis not present

## 2023-07-03 DIAGNOSIS — Z86018 Personal history of other benign neoplasm: Secondary | ICD-10-CM | POA: Diagnosis not present

## 2023-07-03 DIAGNOSIS — D239 Other benign neoplasm of skin, unspecified: Secondary | ICD-10-CM | POA: Diagnosis not present

## 2023-07-03 DIAGNOSIS — D225 Melanocytic nevi of trunk: Secondary | ICD-10-CM | POA: Diagnosis not present

## 2023-07-03 DIAGNOSIS — L988 Other specified disorders of the skin and subcutaneous tissue: Secondary | ICD-10-CM | POA: Diagnosis not present

## 2023-07-03 DIAGNOSIS — L814 Other melanin hyperpigmentation: Secondary | ICD-10-CM | POA: Diagnosis not present

## 2023-07-03 DIAGNOSIS — Z85828 Personal history of other malignant neoplasm of skin: Secondary | ICD-10-CM | POA: Diagnosis not present

## 2023-07-03 DIAGNOSIS — D485 Neoplasm of uncertain behavior of skin: Secondary | ICD-10-CM | POA: Diagnosis not present

## 2023-07-03 DIAGNOSIS — L821 Other seborrheic keratosis: Secondary | ICD-10-CM | POA: Diagnosis not present

## 2023-07-03 DIAGNOSIS — C4401 Basal cell carcinoma of skin of lip: Secondary | ICD-10-CM | POA: Diagnosis not present

## 2023-08-07 DIAGNOSIS — C4401 Basal cell carcinoma of skin of lip: Secondary | ICD-10-CM | POA: Diagnosis not present

## 2023-08-28 DIAGNOSIS — Z23 Encounter for immunization: Secondary | ICD-10-CM | POA: Diagnosis not present

## 2023-08-28 DIAGNOSIS — E782 Mixed hyperlipidemia: Secondary | ICD-10-CM | POA: Diagnosis not present

## 2023-08-28 DIAGNOSIS — E049 Nontoxic goiter, unspecified: Secondary | ICD-10-CM | POA: Diagnosis not present

## 2023-08-28 DIAGNOSIS — E0789 Other specified disorders of thyroid: Secondary | ICD-10-CM | POA: Diagnosis not present

## 2023-08-28 DIAGNOSIS — N182 Chronic kidney disease, stage 2 (mild): Secondary | ICD-10-CM | POA: Diagnosis not present

## 2023-08-28 DIAGNOSIS — Z0000 Encounter for general adult medical examination without abnormal findings: Secondary | ICD-10-CM | POA: Diagnosis not present

## 2023-08-28 DIAGNOSIS — R5383 Other fatigue: Secondary | ICD-10-CM | POA: Diagnosis not present

## 2023-09-04 DIAGNOSIS — R5383 Other fatigue: Secondary | ICD-10-CM | POA: Diagnosis not present

## 2023-09-04 DIAGNOSIS — E0789 Other specified disorders of thyroid: Secondary | ICD-10-CM | POA: Diagnosis not present

## 2023-09-04 DIAGNOSIS — N182 Chronic kidney disease, stage 2 (mild): Secondary | ICD-10-CM | POA: Diagnosis not present

## 2023-09-04 DIAGNOSIS — E049 Nontoxic goiter, unspecified: Secondary | ICD-10-CM | POA: Diagnosis not present

## 2023-09-04 DIAGNOSIS — Z0000 Encounter for general adult medical examination without abnormal findings: Secondary | ICD-10-CM | POA: Diagnosis not present

## 2023-09-04 DIAGNOSIS — E782 Mixed hyperlipidemia: Secondary | ICD-10-CM | POA: Diagnosis not present

## 2023-09-21 ENCOUNTER — Other Ambulatory Visit: Payer: Self-pay | Admitting: Internal Medicine

## 2023-09-21 DIAGNOSIS — Z0000 Encounter for general adult medical examination without abnormal findings: Secondary | ICD-10-CM

## 2023-10-26 ENCOUNTER — Ambulatory Visit
Admission: RE | Admit: 2023-10-26 | Discharge: 2023-10-26 | Disposition: A | Discharge status: Home / Self Care | Payer: Medicare HMO | Source: Ambulatory Visit | Attending: Internal Medicine | Admitting: Internal Medicine

## 2023-10-26 DIAGNOSIS — Z1231 Encounter for screening mammogram for malignant neoplasm of breast: Secondary | ICD-10-CM | POA: Diagnosis not present

## 2023-10-26 DIAGNOSIS — Z0000 Encounter for general adult medical examination without abnormal findings: Secondary | ICD-10-CM

## 2024-02-07 DIAGNOSIS — C44629 Squamous cell carcinoma of skin of left upper limb, including shoulder: Secondary | ICD-10-CM | POA: Diagnosis not present

## 2024-02-07 DIAGNOSIS — D485 Neoplasm of uncertain behavior of skin: Secondary | ICD-10-CM | POA: Diagnosis not present

## 2024-02-07 DIAGNOSIS — L570 Actinic keratosis: Secondary | ICD-10-CM | POA: Diagnosis not present

## 2024-03-12 DIAGNOSIS — R197 Diarrhea, unspecified: Secondary | ICD-10-CM | POA: Diagnosis not present

## 2024-03-12 DIAGNOSIS — C50919 Malignant neoplasm of unspecified site of unspecified female breast: Secondary | ICD-10-CM | POA: Diagnosis not present

## 2024-03-12 DIAGNOSIS — G9389 Other specified disorders of brain: Secondary | ICD-10-CM | POA: Diagnosis not present

## 2024-03-12 DIAGNOSIS — R42 Dizziness and giddiness: Secondary | ICD-10-CM | POA: Diagnosis not present

## 2024-03-12 DIAGNOSIS — R110 Nausea: Secondary | ICD-10-CM | POA: Diagnosis not present

## 2024-03-12 DIAGNOSIS — E042 Nontoxic multinodular goiter: Secondary | ICD-10-CM | POA: Diagnosis not present

## 2024-03-12 DIAGNOSIS — E860 Dehydration: Secondary | ICD-10-CM | POA: Diagnosis not present

## 2024-03-12 DIAGNOSIS — E7800 Pure hypercholesterolemia, unspecified: Secondary | ICD-10-CM | POA: Diagnosis not present

## 2024-03-12 DIAGNOSIS — Z79899 Other long term (current) drug therapy: Secondary | ICD-10-CM | POA: Diagnosis not present

## 2024-03-12 DIAGNOSIS — R112 Nausea with vomiting, unspecified: Secondary | ICD-10-CM | POA: Diagnosis not present

## 2024-03-12 DIAGNOSIS — R55 Syncope and collapse: Secondary | ICD-10-CM | POA: Diagnosis not present

## 2024-03-18 DIAGNOSIS — C44629 Squamous cell carcinoma of skin of left upper limb, including shoulder: Secondary | ICD-10-CM | POA: Diagnosis not present

## 2024-03-20 DIAGNOSIS — E782 Mixed hyperlipidemia: Secondary | ICD-10-CM | POA: Diagnosis not present

## 2024-03-20 DIAGNOSIS — N182 Chronic kidney disease, stage 2 (mild): Secondary | ICD-10-CM | POA: Diagnosis not present

## 2024-03-20 DIAGNOSIS — H8110 Benign paroxysmal vertigo, unspecified ear: Secondary | ICD-10-CM | POA: Diagnosis not present

## 2024-03-20 DIAGNOSIS — E049 Nontoxic goiter, unspecified: Secondary | ICD-10-CM | POA: Diagnosis not present

## 2024-04-18 DIAGNOSIS — H8111 Benign paroxysmal vertigo, right ear: Secondary | ICD-10-CM | POA: Diagnosis not present

## 2024-04-18 DIAGNOSIS — H81391 Other peripheral vertigo, right ear: Secondary | ICD-10-CM | POA: Diagnosis not present

## 2024-05-08 DIAGNOSIS — H5213 Myopia, bilateral: Secondary | ICD-10-CM | POA: Diagnosis not present

## 2024-05-08 DIAGNOSIS — Z135 Encounter for screening for eye and ear disorders: Secondary | ICD-10-CM | POA: Diagnosis not present

## 2024-05-08 DIAGNOSIS — H524 Presbyopia: Secondary | ICD-10-CM | POA: Diagnosis not present

## 2024-05-08 DIAGNOSIS — H2513 Age-related nuclear cataract, bilateral: Secondary | ICD-10-CM | POA: Diagnosis not present

## 2024-05-08 DIAGNOSIS — H52223 Regular astigmatism, bilateral: Secondary | ICD-10-CM | POA: Diagnosis not present

## 2024-07-22 DIAGNOSIS — C4441 Basal cell carcinoma of skin of scalp and neck: Secondary | ICD-10-CM | POA: Diagnosis not present

## 2024-07-22 DIAGNOSIS — D485 Neoplasm of uncertain behavior of skin: Secondary | ICD-10-CM | POA: Diagnosis not present

## 2024-07-22 DIAGNOSIS — Z86018 Personal history of other benign neoplasm: Secondary | ICD-10-CM | POA: Diagnosis not present

## 2024-07-22 DIAGNOSIS — L821 Other seborrheic keratosis: Secondary | ICD-10-CM | POA: Diagnosis not present

## 2024-07-22 DIAGNOSIS — L570 Actinic keratosis: Secondary | ICD-10-CM | POA: Diagnosis not present

## 2024-07-22 DIAGNOSIS — L814 Other melanin hyperpigmentation: Secondary | ICD-10-CM | POA: Diagnosis not present

## 2024-07-22 DIAGNOSIS — D239 Other benign neoplasm of skin, unspecified: Secondary | ICD-10-CM | POA: Diagnosis not present

## 2024-07-22 DIAGNOSIS — Z85828 Personal history of other malignant neoplasm of skin: Secondary | ICD-10-CM | POA: Diagnosis not present

## 2024-07-22 DIAGNOSIS — L578 Other skin changes due to chronic exposure to nonionizing radiation: Secondary | ICD-10-CM | POA: Diagnosis not present

## 2024-07-22 DIAGNOSIS — D225 Melanocytic nevi of trunk: Secondary | ICD-10-CM | POA: Diagnosis not present

## 2024-08-08 DIAGNOSIS — C44719 Basal cell carcinoma of skin of left lower limb, including hip: Secondary | ICD-10-CM | POA: Diagnosis not present

## 2024-08-08 DIAGNOSIS — C4441 Basal cell carcinoma of skin of scalp and neck: Secondary | ICD-10-CM | POA: Diagnosis not present

## 2024-09-02 DIAGNOSIS — Z0000 Encounter for general adult medical examination without abnormal findings: Secondary | ICD-10-CM | POA: Diagnosis not present

## 2024-09-02 DIAGNOSIS — E049 Nontoxic goiter, unspecified: Secondary | ICD-10-CM | POA: Diagnosis not present

## 2024-09-02 DIAGNOSIS — E0789 Other specified disorders of thyroid: Secondary | ICD-10-CM | POA: Diagnosis not present

## 2024-09-02 DIAGNOSIS — R5383 Other fatigue: Secondary | ICD-10-CM | POA: Diagnosis not present

## 2024-09-02 DIAGNOSIS — N182 Chronic kidney disease, stage 2 (mild): Secondary | ICD-10-CM | POA: Diagnosis not present

## 2024-09-02 DIAGNOSIS — Z23 Encounter for immunization: Secondary | ICD-10-CM | POA: Diagnosis not present

## 2024-09-02 DIAGNOSIS — E782 Mixed hyperlipidemia: Secondary | ICD-10-CM | POA: Diagnosis not present

## 2024-09-09 DIAGNOSIS — E782 Mixed hyperlipidemia: Secondary | ICD-10-CM | POA: Diagnosis not present

## 2024-09-09 DIAGNOSIS — N182 Chronic kidney disease, stage 2 (mild): Secondary | ICD-10-CM | POA: Diagnosis not present

## 2024-09-09 DIAGNOSIS — E0789 Other specified disorders of thyroid: Secondary | ICD-10-CM | POA: Diagnosis not present

## 2024-09-09 DIAGNOSIS — R5383 Other fatigue: Secondary | ICD-10-CM | POA: Diagnosis not present

## 2024-09-09 DIAGNOSIS — E049 Nontoxic goiter, unspecified: Secondary | ICD-10-CM | POA: Diagnosis not present

## 2024-09-09 DIAGNOSIS — Z0000 Encounter for general adult medical examination without abnormal findings: Secondary | ICD-10-CM | POA: Diagnosis not present

## 2024-10-01 ENCOUNTER — Other Ambulatory Visit: Payer: Self-pay | Admitting: Internal Medicine

## 2024-10-01 DIAGNOSIS — Z1231 Encounter for screening mammogram for malignant neoplasm of breast: Secondary | ICD-10-CM

## 2024-10-28 ENCOUNTER — Ambulatory Visit

## 2024-10-29 ENCOUNTER — Ambulatory Visit
Admission: RE | Admit: 2024-10-29 | Discharge: 2024-10-29 | Disposition: A | Source: Ambulatory Visit | Attending: Internal Medicine | Admitting: Internal Medicine

## 2024-10-29 ENCOUNTER — Other Ambulatory Visit: Payer: Self-pay | Admitting: Obstetrics & Gynecology

## 2024-10-29 DIAGNOSIS — Z1231 Encounter for screening mammogram for malignant neoplasm of breast: Secondary | ICD-10-CM
